# Patient Record
Sex: Female | Born: 1960 | Race: White | Hispanic: No | Smoking: Former smoker
Health system: Southern US, Community
[De-identification: ages and names within clinical notes are randomized; demographics above are authoritative.]

## PROBLEM LIST (undated history)

## (undated) DIAGNOSIS — F329 Major depressive disorder, single episode, unspecified: Secondary | ICD-10-CM

## (undated) DIAGNOSIS — F419 Anxiety disorder, unspecified: Secondary | ICD-10-CM

## (undated) DIAGNOSIS — N83209 Unspecified ovarian cyst, unspecified side: Secondary | ICD-10-CM

## (undated) DIAGNOSIS — T7840XA Allergy, unspecified, initial encounter: Secondary | ICD-10-CM

## (undated) DIAGNOSIS — F32A Depression, unspecified: Secondary | ICD-10-CM

## (undated) DIAGNOSIS — E079 Disorder of thyroid, unspecified: Secondary | ICD-10-CM

## (undated) DIAGNOSIS — B029 Zoster without complications: Secondary | ICD-10-CM

## (undated) DIAGNOSIS — G43909 Migraine, unspecified, not intractable, without status migrainosus: Secondary | ICD-10-CM

## (undated) DIAGNOSIS — N393 Stress incontinence (female) (male): Principal | ICD-10-CM

## (undated) DIAGNOSIS — K219 Gastro-esophageal reflux disease without esophagitis: Secondary | ICD-10-CM

## (undated) HISTORY — DX: Unspecified ovarian cyst, unspecified side: N83.209

## (undated) HISTORY — DX: Allergy, unspecified, initial encounter: T78.40XA

## (undated) HISTORY — DX: Disorder of thyroid, unspecified: E07.9

## (undated) HISTORY — DX: Depression, unspecified: F32.A

## (undated) HISTORY — DX: Stress incontinence (female) (male): N39.3

## (undated) HISTORY — DX: Migraine, unspecified, not intractable, without status migrainosus: G43.909

## (undated) HISTORY — DX: Anxiety disorder, unspecified: F41.9

## (undated) HISTORY — DX: Zoster without complications: B02.9

## (undated) HISTORY — DX: Major depressive disorder, single episode, unspecified: F32.9

## (undated) HISTORY — DX: Gastro-esophageal reflux disease without esophagitis: K21.9

---
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
## 2000-08-04 ENCOUNTER — Other Ambulatory Visit: Admission: RE | Admit: 2000-08-04 | Discharge: 2000-08-04 | Payer: Self-pay | Admitting: *Deleted

## 2001-05-20 ENCOUNTER — Ambulatory Visit (HOSPITAL_COMMUNITY): Admission: RE | Admit: 2001-05-20 | Discharge: 2001-05-20 | Payer: Self-pay | Admitting: *Deleted

## 2001-05-21 ENCOUNTER — Encounter: Payer: Self-pay | Admitting: *Deleted

## 2001-07-08 ENCOUNTER — Encounter: Payer: Self-pay | Admitting: Orthopedic Surgery

## 2001-07-08 ENCOUNTER — Encounter: Admission: RE | Admit: 2001-07-08 | Discharge: 2001-07-08 | Payer: Self-pay | Admitting: Orthopedic Surgery

## 2002-05-09 ENCOUNTER — Other Ambulatory Visit: Admission: RE | Admit: 2002-05-09 | Discharge: 2002-05-09 | Payer: Self-pay | Admitting: *Deleted

## 2002-05-12 ENCOUNTER — Encounter: Payer: Self-pay | Admitting: *Deleted

## 2002-05-12 ENCOUNTER — Encounter: Admission: RE | Admit: 2002-05-12 | Discharge: 2002-05-12 | Payer: Self-pay | Admitting: *Deleted

## 2007-03-10 ENCOUNTER — Encounter: Admission: RE | Admit: 2007-03-10 | Discharge: 2007-03-10 | Payer: Self-pay | Admitting: Family Medicine

## 2007-03-11 ENCOUNTER — Encounter: Admission: RE | Admit: 2007-03-11 | Discharge: 2007-03-11 | Payer: Self-pay | Admitting: Family Medicine

## 2007-06-03 ENCOUNTER — Ambulatory Visit: Payer: Self-pay | Admitting: Endocrinology

## 2007-07-01 ENCOUNTER — Encounter: Admission: RE | Admit: 2007-07-01 | Discharge: 2007-07-01 | Payer: Self-pay | Admitting: Endocrinology

## 2007-08-24 ENCOUNTER — Encounter: Payer: Self-pay | Admitting: *Deleted

## 2007-08-24 DIAGNOSIS — E042 Nontoxic multinodular goiter: Secondary | ICD-10-CM | POA: Insufficient documentation

## 2007-08-24 DIAGNOSIS — G4700 Insomnia, unspecified: Secondary | ICD-10-CM | POA: Insufficient documentation

## 2007-08-25 ENCOUNTER — Encounter: Payer: Self-pay | Admitting: Endocrinology

## 2007-08-25 ENCOUNTER — Ambulatory Visit: Payer: Self-pay | Admitting: Endocrinology

## 2007-08-25 DIAGNOSIS — F411 Generalized anxiety disorder: Secondary | ICD-10-CM | POA: Insufficient documentation

## 2007-08-25 LAB — CONVERTED CEMR LAB: TSH: 0.15 microintl units/mL — ABNORMAL LOW (ref 0.35–5.50)

## 2007-10-13 ENCOUNTER — Ambulatory Visit: Payer: Self-pay | Admitting: Endocrinology

## 2007-11-23 ENCOUNTER — Ambulatory Visit (HOSPITAL_COMMUNITY): Admission: RE | Admit: 2007-11-23 | Discharge: 2007-11-23 | Payer: Self-pay | Admitting: Family Medicine

## 2008-02-10 ENCOUNTER — Encounter: Admission: RE | Admit: 2008-02-10 | Discharge: 2008-02-10 | Payer: Self-pay | Admitting: Family Medicine

## 2008-02-16 ENCOUNTER — Ambulatory Visit: Payer: Self-pay | Admitting: Endocrinology

## 2008-02-16 DIAGNOSIS — E039 Hypothyroidism, unspecified: Secondary | ICD-10-CM | POA: Insufficient documentation

## 2008-02-17 ENCOUNTER — Telehealth: Payer: Self-pay | Admitting: Endocrinology

## 2008-12-06 ENCOUNTER — Ambulatory Visit: Payer: Self-pay | Admitting: Endocrinology

## 2008-12-06 DIAGNOSIS — I10 Essential (primary) hypertension: Secondary | ICD-10-CM | POA: Insufficient documentation

## 2009-02-15 ENCOUNTER — Other Ambulatory Visit: Admission: RE | Admit: 2009-02-15 | Discharge: 2009-02-15 | Payer: Self-pay | Admitting: Family Medicine

## 2010-11-24 HISTORY — PX: ENDOMETRIAL ABLATION: SHX621

## 2010-12-15 ENCOUNTER — Encounter: Payer: Self-pay | Admitting: Endocrinology

## 2011-04-08 NOTE — Consult Note (Signed)
[facility]                          ENDOCRINOLOGY CONSULTATION   Erin Anderson, Erin Anderson                       MRN:          [id number]  DATE:06/03/2007                            DOB:          11-15-1961    REFERRING PHYSICIAN:  Talmadge Coventry, M.D.   REASON FOR REFERRAL:  Hyperthyroidism.   HISTORY OF PRESENT ILLNESS:  Patient is a 50 year old woman, who was  noted about five years ago to have a suppressed TSH and it has persisted  since then.  Symptomatically, she has about one year of insomnia with  some associated slight weight-gain, but no numbness of her feet.   PAST MEDICAL HISTORY:  Otherwise healthy.   MEDICATIONS:  Her only medication is occasional Xanax.   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  She is separated from her husband.   FAMILY HISTORY:  Negative for thyroid disease in her immediate family.   REVIEW OF SYSTEMS:  Denies the following:  Fever, syncope, shortness of  breath, dysphagia, and tremor.   PHYSICAL EXAMINATION:  Blood pressure 125/81, heart rate 81, temperature  is 98.8, the weight is 150.  GENERAL:  No distress.  SKIN:  No rash, not diaphoretic.  HEENT:  No proptosis, no periorbital swelling.  NECK:  There is a 2 cm diameter right-sided thyroid nodule.  I do not  detect any other abnormality of the thyroid.  CHEST:  Clear to auscultation, no respiratory distress.  CARDIOVASCULAR:  No edema, regular rate and rhythm, no murmur.  NEUROLOGIC:  Alert and oriented, does not appear anxious, nor depressed,  and there is no tremor.   LABORATORY STUDIES:  Forwarded by Dr. Smith Mince:  Thyroid nuclear  medicine study on March 11, 2007, shows a 24-hour uptake of 19.8% with a  patchy uptake, greatest at the right lower pole.  Thyroid ultrasound on  March 11, 2007, shows a multinodular goiter, the largest nodule of which  is on the right lower pole, 23 x 15 x 19 cm.  I have also reviewed  multiple TSHs she has sent, all of which are slightly suppressed,  between 2003 and 2008.   IMPRESSION:  1. Multinodular goiter, which is usually hereditary.  The dominant      nodule is on the right side and is the only one that is palpable.  2. Hyperthyroidism, due to #1.  3. Insomnia, which is unlikely to be thyroid-related, but it is      possible.   PLAN:  1. I discussed treatment options with her and recommended iodine-131      therapy.  She agrees.  This will be ordered and she will return      here in about two months.  2. We discussed the natural history and risks of hyperthyroidism, due      to a multinodular goiter.     Sean A. Everardo All, MD  Electronically Signed    SAE/MedQ  DD: 06/04/2007  DT: 06/04/2007  Job #: [id number]

## 2011-12-03 ENCOUNTER — Ambulatory Visit (INDEPENDENT_AMBULATORY_CARE_PROVIDER_SITE_OTHER): Payer: BC Managed Care – PPO | Admitting: Family Medicine

## 2011-12-03 DIAGNOSIS — N760 Acute vaginitis: Secondary | ICD-10-CM

## 2011-12-30 ENCOUNTER — Ambulatory Visit (INDEPENDENT_AMBULATORY_CARE_PROVIDER_SITE_OTHER): Payer: BC Managed Care – PPO | Admitting: Family Medicine

## 2011-12-30 VITALS — BP 114/70 | HR 68 | Temp 98.0°F | Resp 16 | Ht 63.0 in | Wt 127.0 lb

## 2011-12-30 DIAGNOSIS — N760 Acute vaginitis: Secondary | ICD-10-CM

## 2011-12-30 DIAGNOSIS — IMO0002 Reserved for concepts with insufficient information to code with codable children: Secondary | ICD-10-CM

## 2011-12-30 DIAGNOSIS — N898 Other specified noninflammatory disorders of vagina: Secondary | ICD-10-CM

## 2011-12-30 LAB — POCT WET PREP WITH KOH
Clue Cells Wet Prep HPF POC: 100
Yeast Wet Prep HPF POC: NEGATIVE

## 2011-12-30 MED ORDER — METRONIDAZOLE 500 MG PO TABS: 500.0000 mg | ORAL_TABLET | Freq: Two times a day (BID) | ORAL | Status: AC

## 2011-12-30 NOTE — Progress Notes (Signed)
  Subjective:    Patient ID: Erin Anderson, female    DOB: 13-Dec-1960, 50 y.o.   MRN: [id number]  HPI 51 yo female with c/o vaginal irritation for one month.  Seen 12/03/11 and treated for BV and yeast with metrogel vaginal and diflucan.  Improved some but still irritated.  Still pain with sex at introitus. Haven't had this before.  Perimenopausal - not quite through menopause yet.  Due to go back to gyn in about a month.     Review of Systems Negative except as per HPI     Objective:   Physical Exam  Constitutional: She appears well-developed and well-nourished.  Neurological: She is alert.   Introitus erythematous but otherwise normal Moderate white to yellow discharge at cervix and in vagina Cervix normal  Results for orders placed in visit on 12/30/11  POCT WET PREP WITH KOH      Component Value Range   Trichomonas, UA Negative     Clue Cells Wet Prep HPF POC 100%     Epithelial Wet Prep HPF POC Lg Clumps     Yeast Wet Prep HPF POC Neg     Bacteria Wet Prep HPF POC 3+     RBC Wet Prep HPF POC neg     WBC Wet Prep HPF POC 2-5 with TNTC on Clues     KOH Prep POC              Assessment & Plan:  Vaginal discharge Dyspareunia  Persistent BV - flagyl 500 BID

## 2012-01-01 ENCOUNTER — Ambulatory Visit (INDEPENDENT_AMBULATORY_CARE_PROVIDER_SITE_OTHER): Payer: BC Managed Care – PPO | Admitting: Family Medicine

## 2012-01-01 VITALS — BP 146/98 | HR 84 | Temp 98.8°F | Resp 20 | Ht 64.0 in | Wt 123.4 lb

## 2012-01-01 DIAGNOSIS — R002 Palpitations: Secondary | ICD-10-CM

## 2012-01-01 DIAGNOSIS — I499 Cardiac arrhythmia, unspecified: Secondary | ICD-10-CM

## 2012-01-01 DIAGNOSIS — I491 Atrial premature depolarization: Secondary | ICD-10-CM

## 2012-01-01 LAB — BASIC METABOLIC PANEL
BUN: 12 mg/dL (ref 6–23)
Calcium: 9.8 mg/dL (ref 8.4–10.5)

## 2012-01-01 LAB — TSH: TSH: 0.976 u[IU]/mL (ref 0.350–4.500)

## 2012-01-01 LAB — GC/CHLAMYDIA PROBE AMP, GENITAL: Chlamydia, DNA Probe: NEGATIVE

## 2012-01-01 NOTE — Progress Notes (Signed)
  Subjective:    Patient ID: Erin Anderson, female    DOB: 17-Jan-1961, 51 y.o.   MRN: [id number]  HPI  Patient has had a history of some palpitations in the past, but had an EKG 1 year ago which was normal. [facility] symptoms, and was given antibiotics for fluid in her ears. However the provider noted that she had an irregular heartbeat and recommended she come get checked, so she is over here for that. A week ago boyfriend commented that her heart sounded funny, but she shrugged it off as being from stress and anxiety and maybe nothing. She is scheduled to go out of town today for a mini vacation, but does not feel like she is particularly anxious. She has been under a lot of stress however. She denies any chest pains, but has been aware of a funny feeling there.  She is on thyroid medication but it was last checked almost a year ago.  She does not smoke. She does not do a lot of regular exercise.  Review of Systems Having the ear problems as noted above. No other major symptoms.    Objective:   Physical Exam Alert pleasant female no acute distress. Neck supple without nodes or thyromegaly. Chest is clear to auscultation. Heart has very frequent ectopy, no murmurs were noted. I repeated the blood pressure and it was 120/70.       Assessment & Plan:  Irregular heartbeat History of hypothyroidism Stress  Will check an EKG and thyroid functions and electrolytes and decide on treatment accordingly

## 2012-01-01 NOTE — Patient Instructions (Signed)
You are having what is called premature atrial contractions. [facility]). These are basically a harmless irregular beat that we will be a walk aware of but are not doing any other special tests at this time other than the blood work we have ordered. If you're having more problems with them or ever develop chest pain or passing out symptoms you should return and get checked out further.  Premature Beats You are having premature beats of your heart. This is a common condition which is usually not caused by heart disease or other major illnesses. Premature beats may cause no symptoms. Premature beats may cause palpitations. This makes you feel like your heart is skipping a beat. Sometimes there is even slight chest pain. Premature beats are called PAC's or PVC's, depending on the area of the heart where they start. They may be brought on by a variety of factors including emotional stress and lack of sleep. Caffeine, asthma medicines, stimulants, herbal teas, dietary supplements, and alcohol can also be involved. Evaluation may require heart monitoring at home for several days. You should avoid coffee, tea, colas, and alcoholic beverages and be sure to get plenty of rest over the next few days until your symptoms improve. Do not smoke tobacco. See your caregiver if your symptoms continue after 1 to 2 days of rest. If these are common, treatment may be needed to reduce or eliminate these extra beats.  SEEK IMMEDIATE MEDICAL CARE IF:  You develop severe chest or abdominal pain, or pain that radiates up into the neck, arm or jaw.   You faint or have extreme weakness.   You develop shortness of breath or a racing heart for more than 5 seconds.  Document Released: 12/18/2004 Document Revised: 05/26/2011 Document Reviewed: 01/07/2008 [facility] Patient Information 2012 [place], [place].

## 2012-01-13 ENCOUNTER — Telehealth: Payer: Self-pay

## 2012-01-13 NOTE — Telephone Encounter (Signed)
.  [facility]     PT HAS FINISHED ALL MEDS FOR YEAST INFECTION,STILL HAS Erin Anderson ADVISE   BEST PHONE [phone]   [facility]

## 2012-01-13 NOTE — Telephone Encounter (Signed)
PT IS CALLING AGAIN  [phone]

## 2012-01-14 NOTE — Telephone Encounter (Signed)
Called pt at work number left in message and spoke with pt. D/W her Dr Roslynn Amble recommendation to RTC if Sxs persist after round of medication. Pt asked Dr Roslynn Amble schedule and agreed to try to come back tomorrow.

## 2012-01-14 NOTE — Telephone Encounter (Signed)
LMOM that per Dr Georgiana Shore OV note, pt should RTC for re-eval if Sxs persist after round of medication. Asked for CB with any further questions.

## 2012-01-15 ENCOUNTER — Ambulatory Visit (INDEPENDENT_AMBULATORY_CARE_PROVIDER_SITE_OTHER): Payer: BC Managed Care – PPO | Admitting: Family Medicine

## 2012-01-15 VITALS — BP 132/84 | HR 77 | Temp 98.1°F | Resp 16 | Ht 64.0 in | Wt 124.8 lb

## 2012-01-15 DIAGNOSIS — N771 Vaginitis, vulvitis and vulvovaginitis in diseases classified elsewhere: Secondary | ICD-10-CM

## 2012-01-15 DIAGNOSIS — B373 Candidiasis of vulva and vagina: Secondary | ICD-10-CM

## 2012-01-15 DIAGNOSIS — N760 Acute vaginitis: Secondary | ICD-10-CM

## 2012-01-15 DIAGNOSIS — H6980 Other specified disorders of Eustachian tube, unspecified ear: Secondary | ICD-10-CM

## 2012-01-15 DIAGNOSIS — R002 Palpitations: Secondary | ICD-10-CM

## 2012-01-15 LAB — POCT URINALYSIS DIPSTICK
Ketones, UA: NEGATIVE
Nitrite, UA: NEGATIVE
pH, UA: 7

## 2012-01-15 LAB — POCT UA - MICROSCOPIC ONLY
Bacteria, U Microscopic: NEGATIVE
Crystals, Ur, HPF, POC: NEGATIVE
Mucus, UA: NEGATIVE
RBC, urine, microscopic: NEGATIVE

## 2012-01-15 LAB — POCT WET PREP WITH KOH: KOH Prep POC: POSITIVE

## 2012-01-15 MED ORDER — FLUCONAZOLE 150 MG PO TABS: ORAL_TABLET | ORAL | Status: DC

## 2012-01-15 NOTE — Progress Notes (Signed)
  Subjective:    Patient ID: Erin Anderson, female    DOB: 10/03/1961, 51 y.o.   MRN: [id number]  HPI 51 yo female seen 12/30/11 for vaginal complaints.  Diagnosed with bacterial vaginosis.  Treated with oral flagyl.  Symptoms never fully went away.  Still has pain and irritated area from before.  Still painful during intercourse, especially initially.    Also seen recently for palpitations - occur occasionally.  TSH low normal when checked.    Also seen recently for fluid in the ears.  Thinks it is better but wants it chekced.   Review of Systems    Negative except as per HPI  Objective:   Physical Exam  Constitutional: She appears well-developed and well-nourished.  HENT:  Right Ear: Tympanic membrane and ear canal normal.  Left Ear: Tympanic membrane and ear canal normal.  Cardiovascular: Normal rate, regular rhythm, normal heart sounds and intact distal pulses.   No murmur heard. Pulmonary/Chest: Effort normal and breath sounds normal.  Neurological: She is alert.  Skin: Skin is warm and dry.   Moderate, thick discharge in vaginal vault.  Introitus red and inflamed.   Results for orders placed in visit on 01/15/12  POCT UA - MICROSCOPIC ONLY      Component Value Range   WBC, Ur, HPF, POC neg     RBC, urine, microscopic neg     Bacteria, U Microscopic neg     Mucus, UA neg     Epithelial cells, urine per micros 0-1     Crystals, Ur, HPF, POC neg     Casts, Ur, LPF, POC neg     Yeast, UA Postive    POCT URINALYSIS DIPSTICK      Component Value Range   Color, UA yellow     Clarity, UA clear     Glucose, UA neg     Bilirubin, UA neg     Ketones, UA neg     Spec Grav, UA 1.010     Blood, UA neg     pH, UA 7.0     Protein, UA neg     Urobilinogen, UA 0.2     Nitrite, UA neg     Leukocytes, UA Trace    POCT WET PREP WITH KOH      Component Value Range   Trichomonas, UA Negative     Clue Cells Wet Prep HPF POC 1-3     Epithelial Wet Prep HPF POC 6-13     Yeast Wet  Prep HPF POC positive     Bacteria Wet Prep HPF POC 1+     RBC Wet Prep HPF POC 1-2     WBC Wet Prep HPF POC 6-12     KOH Prep POC Positive         Assessment & Plan:  Vaginitis - yeast.  Diflucan.  Try diaper cream to irritated area for a few days Palpitations - normal heart rate today.  Under a lot of stress.  Long standing problem.  On low dose synthroid.  Will not change at thist ime but could consider if persists. ETD - clear today.

## 2012-01-28 ENCOUNTER — Other Ambulatory Visit: Payer: Self-pay | Admitting: Family Medicine

## 2012-01-28 MED ORDER — LEVOTHYROXINE SODIUM 75 MCG PO TABS: 75.0000 ug | ORAL_TABLET | Freq: Every day | ORAL | Status: DC

## 2012-02-05 ENCOUNTER — Other Ambulatory Visit: Payer: Self-pay | Admitting: Physician Assistant

## 2012-02-05 MED ORDER — TRAMADOL HCL 50 MG PO TABS: 50.0000 mg | ORAL_TABLET | Freq: Three times a day (TID) | ORAL | Status: DC | PRN

## 2012-04-23 ENCOUNTER — Other Ambulatory Visit: Payer: Self-pay | Admitting: Gastroenterology

## 2012-04-23 DIAGNOSIS — R109 Unspecified abdominal pain: Secondary | ICD-10-CM

## 2012-04-27 ENCOUNTER — Ambulatory Visit
Admission: RE | Admit: 2012-04-27 | Discharge: 2012-04-27 | Disposition: A | Discharge status: Home / Self Care | Payer: BC Managed Care – PPO | Source: Ambulatory Visit | Attending: Gastroenterology | Admitting: Gastroenterology

## 2012-04-27 DIAGNOSIS — R109 Unspecified abdominal pain: Secondary | ICD-10-CM

## 2012-05-09 ENCOUNTER — Other Ambulatory Visit: Payer: Self-pay | Admitting: *Deleted

## 2012-05-09 MED ORDER — MOMETASONE FUROATE 50 MCG/ACT NA SUSP: 2.0000 | Freq: Every day | NASAL | Status: DC

## 2012-05-10 ENCOUNTER — Other Ambulatory Visit: Payer: Self-pay | Admitting: Physician Assistant

## 2012-05-14 ENCOUNTER — Telehealth: Payer: Self-pay | Admitting: Family Medicine

## 2012-05-14 MED ORDER — CYCLOBENZAPRINE HCL 5 MG PO TABS: 5.0000 mg | ORAL_TABLET | Freq: Three times a day (TID) | ORAL | Status: DC | PRN

## 2012-05-14 NOTE — Telephone Encounter (Signed)
Patient would like a refill on cyclobenzaprine.  Stated that [facility] filled them the last time she was here in January.  We filled her Tramadol yesturday.  Can we refill this for her?

## 2012-05-14 NOTE — Telephone Encounter (Signed)
Spoke with patient and let her know that rx was sent to pharmacy.

## 2012-05-14 NOTE — Telephone Encounter (Signed)
Done and sent in 

## 2012-06-08 ENCOUNTER — Other Ambulatory Visit: Payer: Self-pay | Admitting: *Deleted

## 2012-06-08 MED ORDER — LORAZEPAM 0.5 MG PO TABS: 0.5000 mg | ORAL_TABLET | Freq: Three times a day (TID) | ORAL | Status: DC

## 2012-07-08 ENCOUNTER — Ambulatory Visit (INDEPENDENT_AMBULATORY_CARE_PROVIDER_SITE_OTHER): Payer: BC Managed Care – PPO | Admitting: Emergency Medicine

## 2012-07-08 VITALS — BP 140/83 | HR 114 | Temp 98.3°F | Resp 18 | Ht 63.0 in | Wt 129.0 lb

## 2012-07-08 DIAGNOSIS — S335XXA Sprain of ligaments of lumbar spine, initial encounter: Secondary | ICD-10-CM

## 2012-07-08 MED ORDER — CYCLOBENZAPRINE HCL 10 MG PO TABS: 10.0000 mg | ORAL_TABLET | Freq: Three times a day (TID) | ORAL | Status: AC | PRN

## 2012-07-08 MED ORDER — KETOROLAC TROMETHAMINE 60 MG/2ML IM SOLN
60.0000 mg | Freq: Once | INTRAMUSCULAR | Status: AC
  Administered 2012-07-08: 60 mg via INTRAMUSCULAR

## 2012-07-08 MED ORDER — HYDROCODONE-ACETAMINOPHEN 5-325 MG PO TABS: 1.0000 | ORAL_TABLET | ORAL | Status: AC | PRN

## 2012-07-08 MED ORDER — NAPROXEN SODIUM 550 MG PO TABS: 550.0000 mg | ORAL_TABLET | Freq: Two times a day (BID) | ORAL | Status: DC

## 2012-07-08 NOTE — Patient Instructions (Signed)
Back Pain, Adult Low back pain is very common. About 1 in 5 people have back pain.The cause of low back pain is rarely dangerous. The pain often gets better over time.About half of people with a sudden onset of back pain feel better in just 2 weeks. About 8 in 10 people feel better by 6 weeks.  CAUSES Some common causes of back pain include:  Strain of the muscles or ligaments supporting the spine.   Wear and tear (degeneration) of the spinal discs.   Arthritis.   Direct injury to the back.  DIAGNOSIS Most of the time, the direct cause of low back pain is not known.However, back pain can be treated effectively even when the exact cause of the pain is unknown.Answering your caregiver's questions about your overall health and symptoms is one of the most accurate ways to make sure the cause of your pain is not dangerous. If your caregiver needs more information, he or she may order lab work or imaging tests (X-rays or MRIs).However, even if imaging tests show changes in your back, this usually does not require surgery. HOME CARE INSTRUCTIONS For many people, back pain returns.Since low back pain is rarely dangerous, it is often a condition that people can learn to manageon their own.   Remain active. It is stressful on the back to sit or stand in one place. Do not sit, drive, or stand in one place for more than 30 minutes at a time. Take short walks on level surfaces as soon as pain allows.Try to increase the length of time you walk each day.   Do not stay in bed.Resting more than 1 or 2 days can delay your recovery.   Do not avoid exercise or work.Your body is made to move.It is not dangerous to be active, even though your back may hurt.Your back will likely heal faster if you return to being active before your pain is gone.   Pay attention to your body when you bend and lift. Many people have less discomfortwhen lifting if they bend their knees, keep the load close to their  bodies,and avoid twisting. Often, the most comfortable positions are those that put less stress on your recovering back.   Find a comfortable position to sleep. Use a firm mattress and lie on your side with your knees slightly bent. If you lie on your back, put a pillow under your knees.   Only take over-the-counter or prescription medicines as directed by your caregiver. Over-the-counter medicines to reduce pain and inflammation are often the most helpful.Your caregiver may prescribe muscle relaxant drugs.These medicines help dull your pain so you can more quickly return to your normal activities and healthy exercise.   Put ice on the injured area.   Put ice in a plastic bag.   Place a towel between your skin and the bag.   Leave the ice on for 15 to 20 minutes, 3 to 4 times a day for the first 2 to 3 days. After that, ice and heat may be alternated to reduce pain and spasms.   Ask your caregiver about trying back exercises and gentle massage. This may be of some benefit.   Avoid feeling anxious or stressed.Stress increases muscle tension and can worsen back pain.It is important to recognize when you are anxious or stressed and learn ways to manage it.Exercise is a great option.  SEEK MEDICAL CARE IF:  You have pain that is not relieved with rest or medicine.   You have   pain that does not improve in 1 week.   You have new symptoms.   You are generally not feeling well.  SEEK IMMEDIATE MEDICAL CARE IF:   You have pain that radiates from your back into your legs.   You develop new bowel or bladder control problems.   You have unusual weakness or numbness in your arms or legs.   You develop nausea or vomiting.   You develop abdominal pain.   You feel faint.  Document Released: 11/10/2005 Document Revised: 10/30/2011 Document Reviewed: 03/31/2011 [employer] Patient Information 2012 [employer]. 

## 2012-07-08 NOTE — Progress Notes (Signed)
   Date:  07/08/2012   Name:  Erin Anderson   DOB:  Apr 03, 1961   MRN:  [id number] Gender: female  Age: 51 y.o.  PCP:  Romero Belling, MD    Chief Complaint: Back Pain   History of Present Illness:  Erin Anderson is a 51 y.o. pleasant patient who presents with the following:  Stretching and bent from waist touching the floor and developed sharp pain in her low back while straightening up.  Non radiating and no neuro symptoms.  No history of back pain.  Went to work but unable to sit still due to pain and came to office.  Patient Active Problem List  Diagnosis  . GOITER, MULTINODULAR  . HYPOTHYROIDISM, POST-RADIATION  . ANXIETY  . HYPERTENSION  . INSOMNIA    No past medical history on file.  No past surgical history on file.  History  Substance Use Topics  . Smoking status: Former Smoker -- 1.0 packs/day for 10 years    Types: Cigarettes    Quit date: 12/31/1985  . Smokeless tobacco: Never Used  . Alcohol Use: Not on file    No family history on file.  Allergies  Allergen Reactions  . Sulfa Antibiotics   . Sulfonamide Derivatives     Medication list has been reviewed and updated.  Current Outpatient Prescriptions on File Prior to Visit  Medication Sig Dispense Refill  . levothyroxine (SYNTHROID, LEVOTHROID) 75 MCG tablet Take 1 tablet (75 mcg total) by mouth daily.  90 tablet  1  . LORazepam (ATIVAN) 0.5 MG tablet Take 1 tablet (0.5 mg total) by mouth every 8 (eight) hours.  60 tablet  0  . mometasone (NASONEX) 50 MCG/ACT nasal spray Place 2 sprays into the nose daily.  17 g  5  . sertraline (ZOLOFT) 50 MG tablet Take 50 mg by mouth daily.      . traMADol (ULTRAM) 50 MG tablet TAKE ONE TABLET BY MOUTH THREE TIMES DAILY AS NEEDED FOR PAIN  30 tablet  0  . fluconazole (DIFLUCAN) 150 MG tablet 1 po today.  Repeat in 3 days.  2 tablet  0    Review of Systems:  As per HPI, otherwise negative.    Physical Examination: Filed Vitals:   07/08/12 1246  BP:  140/83  Pulse: 114  Temp: 98.3 F (36.8 C)  Resp: 18   Filed Vitals:   07/08/12 1246  Height: 5\' 3"  (1.6 m)  Weight: 129 lb (58.514 kg)   Body mass index is 22.85 kg/(m^2). Ideal Body Weight: Weight in (lb) to have BMI = 25: 140.8    GEN: WDWN, NAD, Non-toxic, Alert & Oriented x 3 HEENT: Atraumatic, Normocephalic.  Ears and Nose: No external deformity. EXTR: No clubbing/cyanosis/edema NEURO: Normal gait.  PSYCH: Normally interactive. Conversant. Not depressed or anxious appearing.  Calm demeanor.  Back:  Tender in sacral paraspinous muscles.  Neuro grossly intact with equal muscle strength.  SLR negative  Assessment and Plan: Lumbar strain Anaprox Flexeril vicodin Follow up as needed  Carmelina Dane, MD

## 2012-07-08 NOTE — Addendum Note (Signed)
Addended by: Sheldon Silvan on: 07/08/2012 01:43 PM   Modules accepted: Orders

## 2012-08-13 ENCOUNTER — Other Ambulatory Visit: Payer: Self-pay | Admitting: Physician Assistant

## 2012-08-22 ENCOUNTER — Other Ambulatory Visit: Payer: Self-pay | Admitting: *Deleted

## 2012-08-22 MED ORDER — LORAZEPAM 0.5 MG PO TABS: 0.5000 mg | ORAL_TABLET | Freq: Three times a day (TID) | ORAL | Status: DC

## 2012-08-25 ENCOUNTER — Other Ambulatory Visit: Payer: Self-pay | Admitting: Physician Assistant

## 2012-10-19 ENCOUNTER — Other Ambulatory Visit: Payer: Self-pay | Admitting: Family Medicine

## 2012-10-20 ENCOUNTER — Other Ambulatory Visit: Payer: Self-pay | Admitting: *Deleted

## 2012-10-20 MED ORDER — LORAZEPAM 0.5 MG PO TABS: 0.5000 mg | ORAL_TABLET | Freq: Three times a day (TID) | ORAL | Status: DC

## 2012-10-20 NOTE — Telephone Encounter (Signed)
rx called in

## 2012-11-08 ENCOUNTER — Other Ambulatory Visit: Payer: Self-pay | Admitting: Physician Assistant

## 2012-11-08 NOTE — Telephone Encounter (Signed)
Needs office visit.

## 2012-12-12 ENCOUNTER — Other Ambulatory Visit: Payer: Self-pay | Admitting: Physician Assistant

## 2012-12-12 NOTE — Telephone Encounter (Signed)
Needs/ OV labs, sent with note in regards to this.

## 2012-12-24 ENCOUNTER — Ambulatory Visit: Payer: BC Managed Care – PPO | Admitting: Family Medicine

## 2012-12-31 ENCOUNTER — Ambulatory Visit: Payer: BC Managed Care – PPO | Admitting: Family Medicine

## 2013-01-21 ENCOUNTER — Ambulatory Visit: Payer: BC Managed Care – PPO | Admitting: Family Medicine

## 2013-01-21 ENCOUNTER — Ambulatory Visit (INDEPENDENT_AMBULATORY_CARE_PROVIDER_SITE_OTHER): Payer: BC Managed Care – PPO | Admitting: Family Medicine

## 2013-01-21 ENCOUNTER — Encounter: Payer: Self-pay | Admitting: Family Medicine

## 2013-01-21 VITALS — BP 164/96 | HR 79 | Temp 98.1°F | Resp 16 | Ht 64.0 in | Wt 139.0 lb

## 2013-01-21 DIAGNOSIS — E890 Postprocedural hypothyroidism: Secondary | ICD-10-CM

## 2013-01-21 DIAGNOSIS — R51 Headache: Secondary | ICD-10-CM

## 2013-01-21 DIAGNOSIS — I10 Essential (primary) hypertension: Secondary | ICD-10-CM

## 2013-01-21 MED ORDER — CETIRIZINE HCL 10 MG PO TABS: 10.0000 mg | ORAL_TABLET | Freq: Every day | ORAL | Status: DC

## 2013-01-21 MED ORDER — BUTALBITAL-APAP-CAFF-COD 50-325-40-30 MG PO CAPS
1.0000 | ORAL_CAPSULE | ORAL | Status: DC | PRN
Start: 2013-01-21 — End: 2013-02-11

## 2013-01-21 MED ORDER — SERTRALINE HCL 100 MG PO TABS: 50.0000 mg | ORAL_TABLET | Freq: Every day | ORAL | Status: DC

## 2013-01-21 MED ORDER — FLUTICASONE PROPIONATE 50 MCG/ACT NA SUSP: 2.0000 | Freq: Every day | NASAL | Status: DC

## 2013-01-21 MED ORDER — PROMETHAZINE HCL 25 MG PO TABS: 25.0000 mg | ORAL_TABLET | Freq: Three times a day (TID) | ORAL | Status: DC | PRN

## 2013-01-21 MED ORDER — LEVOTHYROXINE SODIUM 75 MCG PO TABS: ORAL_TABLET | ORAL | Status: DC

## 2013-01-21 NOTE — Progress Notes (Signed)
Subjective:    Patient ID: Erin Anderson, female    DOB: 11-05-1961, 52 y.o.   MRN: [id number]  HPI  Has some chronic HAs, getting worse, waking up with them - wakes up from sleep - is in center of forehead - was so severe she almost went to hosp.  Has started having nausea and vomiting due to severity of pian. Her prev PCP tried her on migraine meds like imitrex but made her arms feel weird and din't help HAs at all and she saw Benny Lennert PA who tried her on flexeril and tramadol - not touching it.   Not having a HA daily - will sometimes get daily and then have a break for a few days to a wk - but then when she gets them they will occur daily for a sev days and last the whole day when she gets a headache - until she can go to sleep. Have now have daily headaches for about 2 wks and much worse than usual with pain in the center of her forehead, no radiation. +photophobia and phonophobia, no vision changes.no aura, no lacrimation or rhinorrhea Takes sudafed which might help a little, heating pad on face or back of neck. Getting so bad she has to leave work. Was having prob w/ her stomach so had upper and lower endoscopy and was put on prilosec so told to stop taking nsaids so has been trying to stay away from excedrin migraine, bc powders, etc.  Takes naprosyn occ and tylenol occ but hasn't been touching it at all. Does not take any meds > 3d/wk. Does have a h/o sinus infections so initially thought it was that but now much worse than a sinus headache and otc allergy meds haven't helped at all.  Works at [employer] and has nurse check her BP occ outside of office at it is always nml.  Past Medical History  Diagnosis Date  . Allergy   . Anxiety    Current Outpatient Prescriptions on File Prior to Visit  Medication Sig Dispense Refill  . LORazepam (ATIVAN) 0.5 MG tablet Take 1 tablet (0.5 mg total) by mouth every 8 (eight) hours.  60 tablet  0  . mometasone (NASONEX) 50 MCG/ACT nasal  spray Place 2 sprays into the nose daily.  17 g  5  . naproxen sodium (ANAPROX DS) 550 MG tablet Take 1 tablet (550 mg total) by mouth 2 (two) times daily with a meal.  40 tablet  0  . fluconazole (DIFLUCAN) 150 MG tablet 1 po today.  Repeat in 3 days.  2 tablet  0   No current facility-administered medications on file prior to visit.   Allergies  Allergen Reactions  . Sulfa Antibiotics   . Sulfonamide Derivatives      Review of Systems  Constitutional: Positive for fatigue. Negative for fever, chills, diaphoresis, appetite change and unexpected weight change.  HENT: Positive for sinus pressure. Negative for ear pain, congestion, rhinorrhea, neck pain, neck stiffness, dental problem and tinnitus.   Eyes: Positive for photophobia. Negative for pain, discharge, redness, itching and visual disturbance.  Gastrointestinal: Positive for nausea, vomiting, abdominal pain and abdominal distention.  Skin: Negative for rash.  Neurological: Positive for headaches. Negative for dizziness, tremors, seizures, syncope, facial asymmetry, speech difficulty, weakness, light-headedness and numbness.  Psychiatric/Behavioral: Positive for sleep disturbance.      BP 164/96  Pulse 79  Temp(Src) 98.1 F (36.7 C)  Resp 16  Ht 5\' 4"  (1.626 m)  Wt 139 lb (63.05 kg)  BMI 23.85 kg/m2 Objective:   Physical Exam  Constitutional: She is oriented to person, place, and time. She appears well-developed and well-nourished.  Laying on exam table in dark room, all lights off.  HENT:  Head: Normocephalic and atraumatic.  Right Ear: External ear and ear canal normal. A middle ear effusion is present.  Left Ear: External ear and ear canal normal. Tympanic membrane is retracted. A middle ear effusion is present.  Nose: Mucosal edema present. No rhinorrhea. Right sinus exhibits frontal sinus tenderness. Right sinus exhibits no maxillary sinus tenderness. Left sinus exhibits frontal sinus tenderness. Left sinus exhibits  no maxillary sinus tenderness.  Mouth/Throat: Uvula is midline, oropharynx is clear and moist and mucous membranes are normal. No oropharyngeal exudate.  Eyes: Conjunctivae and EOM are normal. Pupils are equal, round, and reactive to light.  Neck: Normal range of motion. Neck supple. No thyromegaly present.  Cardiovascular: Normal rate, regular rhythm, normal heart sounds and intact distal pulses.   Pulmonary/Chest: Effort normal and breath sounds normal. No respiratory distress.  Neurological: She is alert and oriented to person, place, and time. She has normal strength. No cranial nerve deficit or sensory deficit. Coordination and gait normal.  Skin: Skin is warm and dry. She is not diaphoretic.  Psychiatric: She has a normal mood and affect. Her behavior is normal.          Assessment & Plan:  HYPOTHYROIDISM, POST-RADIATION - Plan: TSH, refilled levothyroxine - additional refills ok if tsh within range.  HYPERTENSION - check BP outside of the office, at work. Likely elev today 2/2 pain but if cont to be elev, will need to start BP meds.  Headache - Plan: promethazine (PHENERGAN) 25 MG tablet, butalbital-acetaminophen-caffeine (FIORICET/CODEINE) 50-325-40-30 MG per capsule, cetirizine (ZYRTEC) 10 MG tablet, fluticasone (FLONASE) 50 MCG/ACT nasal spray, CT Maxillofacial WO CM, TSH.  I do wonder if this could be coming from a chronic sinusitis due to its location so will check sinus CT and if + -> refer to ENT. If neg, refer to neurology HA clinic and cons checking brain MRI.  Pt has CPP sched for < 1 mo so we will f/u then   Meds ordered this encounter  Medications  . levothyroxine (SYNTHROID, LEVOTHROID) 75 MCG tablet    Sig: TAKE ONE TABLET BY MOUTH ONE TIME DAILY    Dispense:  90 tablet    Refill:  0  . sertraline (ZOLOFT) 100 MG tablet    Sig: Take 0.5 tablets (50 mg total) by mouth daily.    Dispense:  45 tablet    Refill:  3         . promethazine (PHENERGAN) 25 MG tablet     Sig: Take 1 tablet (25 mg total) by mouth every 8 (eight) hours as needed for nausea.    Dispense:  20 tablet    Refill:  0  . butalbital-acetaminophen-caffeine (FIORICET/CODEINE) 50-325-40-30 MG per capsule    Sig: Take 1 capsule by mouth every 4 (four) hours as needed for headache.    Dispense:  30 capsule    Refill:  0  . cetirizine (ZYRTEC) 10 MG tablet    Sig: Take 1 tablet (10 mg total) by mouth daily.    Dispense:  30 tablet    Refill:  11  . fluticasone (FLONASE) 50 MCG/ACT nasal spray    Sig: Place 2 sprays into the nose daily.    Dispense:  16 g    Refill:  6    

## 2013-01-24 MED ORDER — LEVOTHYROXINE SODIUM 75 MCG PO TABS: ORAL_TABLET | ORAL | Status: DC

## 2013-01-27 ENCOUNTER — Other Ambulatory Visit: Payer: Self-pay | Admitting: Radiology

## 2013-01-27 ENCOUNTER — Ambulatory Visit
Admission: RE | Admit: 2013-01-27 | Discharge: 2013-01-27 | Disposition: A | Discharge status: Home / Self Care | Payer: BC Managed Care – PPO | Source: Ambulatory Visit | Attending: Family Medicine | Admitting: Family Medicine

## 2013-01-27 DIAGNOSIS — R51 Headache: Secondary | ICD-10-CM

## 2013-01-30 ENCOUNTER — Ambulatory Visit: Payer: BC Managed Care – PPO

## 2013-01-30 ENCOUNTER — Ambulatory Visit (INDEPENDENT_AMBULATORY_CARE_PROVIDER_SITE_OTHER): Payer: BC Managed Care – PPO | Admitting: Family Medicine

## 2013-01-30 ENCOUNTER — Other Ambulatory Visit: Payer: Self-pay | Admitting: Family Medicine

## 2013-01-30 VITALS — BP 162/92 | HR 95 | Temp 98.1°F | Resp 16 | Ht 64.0 in | Wt 135.0 lb

## 2013-01-30 DIAGNOSIS — IMO0002 Reserved for concepts with insufficient information to code with codable children: Secondary | ICD-10-CM

## 2013-01-30 DIAGNOSIS — M25519 Pain in unspecified shoulder: Secondary | ICD-10-CM

## 2013-01-30 DIAGNOSIS — S161XXA Strain of muscle, fascia and tendon at neck level, initial encounter: Secondary | ICD-10-CM

## 2013-01-30 DIAGNOSIS — R519 Headache, unspecified: Secondary | ICD-10-CM | POA: Insufficient documentation

## 2013-01-30 DIAGNOSIS — M25512 Pain in left shoulder: Secondary | ICD-10-CM

## 2013-01-30 DIAGNOSIS — S139XXA Sprain of joints and ligaments of unspecified parts of neck, initial encounter: Secondary | ICD-10-CM

## 2013-01-30 DIAGNOSIS — M542 Cervicalgia: Secondary | ICD-10-CM

## 2013-01-30 DIAGNOSIS — S46912A Strain of unspecified muscle, fascia and tendon at shoulder and upper arm level, left arm, initial encounter: Secondary | ICD-10-CM

## 2013-01-30 DIAGNOSIS — R51 Headache: Secondary | ICD-10-CM

## 2013-01-30 MED ORDER — HYDROCODONE-ACETAMINOPHEN 5-325 MG PO TABS: 1.0000 | ORAL_TABLET | Freq: Four times a day (QID) | ORAL | Status: DC | PRN

## 2013-01-30 NOTE — Patient Instructions (Signed)
Ice  Aleve  Hydrocodone only for severe pain  Wear sling until it is feeling better.  Return as needed but also let Dr. Clelia Croft recheck you at your physical.
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
## 2013-02-02 ENCOUNTER — Telehealth: Payer: Self-pay | Admitting: Radiology

## 2013-02-02 NOTE — Telephone Encounter (Signed)
[facility] sent a fax requesting a refill for her Lorazepam 0.5 mg. Please advise

## 2013-02-02 NOTE — Telephone Encounter (Signed)
Patient states you called her, French Ana called about CT scan, she states you can call her back at work if you called also. Amy

## 2013-02-07 ENCOUNTER — Telehealth: Payer: Self-pay

## 2013-02-07 MED ORDER — LORAZEPAM 0.5 MG PO TABS: 0.5000 mg | ORAL_TABLET | Freq: Three times a day (TID) | ORAL | Status: DC

## 2013-02-07 NOTE — Telephone Encounter (Signed)
Ok to refill with 10 tabs only. Pt has appt on Friday for a CPE. We can discuss then.  i have only seen her once for an acute visit for HA so will not refill further without discussion about the medicine at an OV as it is a controlled substances which I only refill at OV.  [facility] Policy for Prescribing Controlled Substances (Revised 09/2012) 1. Prescriptions for controlled substances will be filled by ONE provider at [facility] with whom you have established and developed a plan for your care, including follow-up. 2. You are encouraged to schedule an appointment with your prescriber at our appointment center for follow-up visits whenever possible. 3. If you request a prescription for the controlled substance while at [facility] for an acute problem (with someone other than your regular prescriber), you MAY be given a ONE-TIME prescription for a 30-day supply of the controlled substance, to allow time for you to return to see your regular prescriber for additional prescriptions.

## 2013-02-07 NOTE — Telephone Encounter (Signed)
RF req for Lorazepam 0.5 mg, 1 tab Q 8 hrs prn. Dr Clelia Croft, do you want to RF for pt?

## 2013-02-07 NOTE — Telephone Encounter (Signed)
Patient wants to speak with Dr. Clelia Croft directly,   She is upset with the conversation she had with "someone" in our office.    CBN:   [phone]

## 2013-02-07 NOTE — Telephone Encounter (Signed)
See other phone note

## 2013-02-07 NOTE — Telephone Encounter (Signed)
Ms. Orzechowski was speaking with Erin Anderson who was relaying to the patient EXACTLY what I had requested.  If pt did not want to hear my instructions, she may come in for an appointment to speak to me directly.

## 2013-02-07 NOTE — Telephone Encounter (Signed)
Please advise on Lorazepam Rx

## 2013-02-07 NOTE — Telephone Encounter (Signed)
Called this in for patient. She has been advised of our Rx she is very angry on the phone she states I am going to listen to her, she raised her voice on the line and was very argumentative states she has never abused these meds I disconnected the call when the patient raised her voice at me. To you FYI

## 2013-02-07 NOTE — Telephone Encounter (Signed)
Patient may discuss with you when she comes in on Friday

## 2013-02-11 ENCOUNTER — Ambulatory Visit (INDEPENDENT_AMBULATORY_CARE_PROVIDER_SITE_OTHER): Payer: BC Managed Care – PPO | Admitting: Family Medicine

## 2013-02-11 ENCOUNTER — Other Ambulatory Visit: Payer: Self-pay | Admitting: Family Medicine

## 2013-02-11 ENCOUNTER — Encounter: Payer: Self-pay | Admitting: Family Medicine

## 2013-02-11 VITALS — BP 150/110 | HR 66 | Temp 98.0°F | Resp 16 | Ht 64.0 in | Wt 135.2 lb

## 2013-02-11 DIAGNOSIS — N898 Other specified noninflammatory disorders of vagina: Secondary | ICD-10-CM

## 2013-02-11 DIAGNOSIS — Z79899 Other long term (current) drug therapy: Secondary | ICD-10-CM

## 2013-02-11 DIAGNOSIS — Z1231 Encounter for screening mammogram for malignant neoplasm of breast: Secondary | ICD-10-CM

## 2013-02-11 DIAGNOSIS — M79672 Pain in left foot: Secondary | ICD-10-CM

## 2013-02-11 DIAGNOSIS — M25579 Pain in unspecified ankle and joints of unspecified foot: Secondary | ICD-10-CM

## 2013-02-11 DIAGNOSIS — Z0000 Encounter for general adult medical examination without abnormal findings: Secondary | ICD-10-CM

## 2013-02-11 DIAGNOSIS — Z79890 Hormone replacement therapy (postmenopausal): Secondary | ICD-10-CM

## 2013-02-11 DIAGNOSIS — M79609 Pain in unspecified limb: Secondary | ICD-10-CM

## 2013-02-11 DIAGNOSIS — S46912D Strain of unspecified muscle, fascia and tendon at shoulder and upper arm level, left arm, subsequent encounter: Secondary | ICD-10-CM

## 2013-02-11 LAB — POCT WET PREP WITH KOH
Clue Cells Wet Prep HPF POC: NEGATIVE
KOH Prep POC: NEGATIVE
RBC Wet Prep HPF POC: NEGATIVE

## 2013-02-11 LAB — CBC WITH DIFFERENTIAL/PLATELET
Basophils Absolute: 0 10*3/uL (ref 0.0–0.1)
Eosinophils Absolute: 0.2 10*3/uL (ref 0.0–0.7)
Lymphocytes Relative: 30 % (ref 12–46)
Lymphs Abs: 2 10*3/uL (ref 0.7–4.0)
MCH: 28.8 pg (ref 26.0–34.0)
Neutrophils Relative %: 59 % (ref 43–77)
Platelets: 372 10*3/uL (ref 150–400)
RBC: 4.89 MIL/uL (ref 3.87–5.11)
RDW: 16.4 % — ABNORMAL HIGH (ref 11.5–15.5)
WBC: 6.5 10*3/uL (ref 4.0–10.5)

## 2013-02-11 LAB — LIPID PANEL
HDL: 69 mg/dL (ref >39)
Total CHOL/HDL Ratio: 2.6 Ratio
VLDL: 14 mg/dL (ref 0–40)

## 2013-02-11 LAB — COMPREHENSIVE METABOLIC PANEL
ALT: 13 U/L (ref 0–35)
AST: 25 U/L (ref 0–37)
CO2: 29 mEq/L (ref 19–32)
Creat: 0.7 mg/dL (ref 0.50–1.10)
Sodium: 137 mEq/L (ref 135–145)
Total Bilirubin: 0.5 mg/dL (ref 0.3–1.2)
Total Protein: 7.4 g/dL (ref 6.0–8.3)

## 2013-02-11 MED ORDER — OMEPRAZOLE 40 MG PO CPDR: 40.0000 mg | DELAYED_RELEASE_CAPSULE | Freq: Every day | ORAL | Status: DC

## 2013-02-11 MED ORDER — LORAZEPAM 0.5 MG PO TABS: 0.5000 mg | ORAL_TABLET | Freq: Three times a day (TID) | ORAL | Status: DC

## 2013-02-11 MED ORDER — MELOXICAM 15 MG PO TABS: 15.0000 mg | ORAL_TABLET | Freq: Every day | ORAL | Status: DC

## 2013-02-11 NOTE — Patient Instructions (Addendum)
[facility] Policy for Prescribing Controlled Substances (Revised 09/2012) 1. Prescriptions for controlled substances will be filled by ONE provider at [facility] with whom you have established and developed a plan for your care, including follow-up. 2. You are encouraged to schedule an appointment with your prescriber at our appointment center for follow-up visits whenever possible. 3. If you request a prescription for the controlled substance while at [facility] for an acute problem (with someone other than your regular prescriber), you MAY be given a ONE-TIME prescription for a 30-day supply of the controlled substance, to allow time for you to return to see your regular prescriber for additional prescriptions.  Keeping You Healthy  Get These Tests  Blood Pressure- Have your blood pressure checked by your healthcare provider at least once a year.  Normal blood pressure is 120/80.  Weight- Have your body mass index (BMI) calculated to screen for obesity.  BMI is a measure of body fat based on height and weight.  You can calculate your own BMI at https://www.west-esparza.com/  Cholesterol- Have your cholesterol checked every year.  Diabetes- Have your blood sugar checked every year if you have high blood pressure, high cholesterol, a family history of diabetes or if you are overweight.  Pap Smear- Have a pap smear every 1 to 3 years if you have been sexually active.  If you are older than 65 and recent pap smears have been normal you may not need additional pap smears.  In addition, if you have had a hysterectomy  For benign disease additional pap smears are not necessary.  Mammogram-Yearly mammograms are essential for early detection of breast cancer  Screening for Colon Cancer- Colonoscopy starting at age 39. Screening may begin sooner depending on your family history and other health conditions.  Follow up colonoscopy as directed by your Gastroenterologist.  Screening for Osteoporosis- Screening begins at age 92  with bone density scanning, sooner if you are at higher risk for developing Osteoporosis.  Get these medicines  Calcium with Vitamin D- Your body requires 1200-1500 mg of Calcium a day and [phone] IU of Vitamin D a day.  You can only absorb 500 mg of Calcium at a time therefore Calcium must be taken in 2 or 3 separate doses throughout the day.  Hormones- Hormone therapy has been associated with increased risk for certain cancers and heart disease.  Talk to your healthcare provider about if you need relief from menopausal symptoms.  Aspirin- Ask your healthcare provider about taking Aspirin to prevent Heart Disease and Stroke.  Get these Immuniztions  Flu shot- Every fall  Pneumonia shot- Once after the age of 39; if you are younger ask your healthcare provider if you need a pneumonia shot.  Tetanus- Every ten years.  Zostavax- Once after the age of 20 to prevent shingles.  Take these steps  Don't smoke- Your healthcare provider can help you quit. For tips on how to quit, ask your healthcare provider or go to www.smokefree.gov or call 1-800 QUIT-NOW.  Be physically active- Exercise 5 days a week for a minimum of 30 minutes.  If you are not already physically active, start slow and gradually work up to 30 minutes of moderate physical activity.  Try walking, dancing, bike riding, swimming, etc.  Eat a healthy diet- Eat a variety of healthy foods such as fruits, vegetables, whole grains, low fat milk, low fat cheeses, yogurt, lean meats, chicken, fish, eggs, dried beans, tofu, etc.  For more information go to www.thenutritionsource.org  Dental visit- Brush  and floss teeth twice daily; visit your dentist twice a year.  Eye exam- Visit your Optometrist or Ophthalmologist yearly.  Drink alcohol in moderation- Limit alcohol intake to one drink or less a day.  Never drink and drive.  Depression- Your emotional health is as important as your physical health.  If you're feeling down or losing  interest in things you normally enjoy, please talk to your healthcare provider.  Seat Belts- can save your life; always wear one  Smoke/Carbon Monoxide detectors- These detectors need to be installed on the appropriate level of your home.  Replace batteries at least once a year.  Violence- If anyone is threatening or hurting you, please tell your healthcare provider.  Living Will/ Health care power of attorney- Discuss with your healthcare provider and family.
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
## 2013-02-12 LAB — FOLLICLE STIMULATING HORMONE: FSH: 100.3 m[IU]/mL

## 2013-02-14 LAB — ESTRADIOL: Estradiol: 31.8 pg/mL

## 2013-02-15 LAB — PAP IG, CT-NG NAA, HPV HIGH-RISK
Chlamydia Probe Amp: NEGATIVE
GC Probe Amp: NEGATIVE
HPV DNA High Risk: NOT DETECTED

## 2013-03-11 ENCOUNTER — Ambulatory Visit: Payer: BC Managed Care – PPO

## 2013-03-12 ENCOUNTER — Telehealth: Payer: Self-pay
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
## 2013-03-13 MED ORDER — SERTRALINE HCL 100 MG PO TABS: 100.0000 mg | ORAL_TABLET | Freq: Every day | ORAL | Status: DC

## 2013-03-13 NOTE — Telephone Encounter (Signed)
Yes, that's fine - refill sertraline 100mg  1 po qd disp 90, 1 refill.

## 2013-03-13 NOTE — Telephone Encounter (Signed)
Pt notified that rx was sent in to pharmacy

## 2013-03-17 ENCOUNTER — Ambulatory Visit: Payer: BC Managed Care – PPO

## 2013-04-08 ENCOUNTER — Telehealth: Payer: Self-pay

## 2013-04-08 DIAGNOSIS — R51 Headache: Secondary | ICD-10-CM

## 2013-04-08 DIAGNOSIS — J309 Allergic rhinitis, unspecified: Secondary | ICD-10-CM

## 2013-04-08 NOTE — Telephone Encounter (Signed)
Pharm requests RF of But/APAP/Caf cap #30. Dr Clelia Croft, I don't see where you have Rxd this for pt before but you did discuss her HAs at March OV. Do you want to send in a Rx?

## 2013-04-11 NOTE — Telephone Encounter (Signed)
Pharm sent a 2nd request for this RF.

## 2013-04-12 MED ORDER — BUTALBITAL-APAP-CAFFEINE 50-325-40 MG PO TABS: 1.0000 | ORAL_TABLET | Freq: Four times a day (QID) | ORAL | Status: DC | PRN

## 2013-04-12 NOTE — Telephone Encounter (Signed)
Thanks, done.

## 2013-04-12 NOTE — Telephone Encounter (Signed)
Yes, ok to call in 1 refill.  Pt had been referred to neurology headache clinic for eval - her appt was in April - did not get consult note back (at least nothing in chart).  Did she go? What was the assessment and treatment plan?

## 2013-04-12 NOTE — Telephone Encounter (Signed)
Yes please and thank you. Pt should schedule appt w/ me to discuss and fine to place allergist referral for "sinus headaches."

## 2013-04-12 NOTE — Telephone Encounter (Signed)
Printed. I called patient about the headache clinic and she did go there, states she was not happy about the visit and wants to discuss with you. The Neurologist recommended she d/c ALL her medications, and she was not happy about this advise, she was put on Topamax , but did not want to take this medication. She wants to know if she can see an allergist/patient indicates she thinks allergies are causing her headaches,  I pended this referral. She was transferred to schedule an appt with you to discuss all this. Will take a while to get allergist appt, so I told her we could probably go ahead and start on the referral.

## 2013-04-22 ENCOUNTER — Ambulatory Visit: Payer: BC Managed Care – PPO | Admitting: Family Medicine

## 2013-04-29 ENCOUNTER — Ambulatory Visit: Payer: BC Managed Care – PPO | Admitting: Family Medicine

## 2013-05-05 ENCOUNTER — Ambulatory Visit (INDEPENDENT_AMBULATORY_CARE_PROVIDER_SITE_OTHER): Payer: BC Managed Care – PPO | Admitting: Family Medicine

## 2013-05-05 VITALS — BP 142/86 | HR 62 | Temp 97.9°F | Resp 16 | Ht 64.0 in | Wt 138.0 lb

## 2013-05-05 DIAGNOSIS — M7542 Impingement syndrome of left shoulder: Secondary | ICD-10-CM

## 2013-05-05 DIAGNOSIS — G43909 Migraine, unspecified, not intractable, without status migrainosus: Secondary | ICD-10-CM

## 2013-05-05 DIAGNOSIS — M5432 Sciatica, left side: Secondary | ICD-10-CM

## 2013-05-05 DIAGNOSIS — R519 Headache, unspecified: Secondary | ICD-10-CM

## 2013-05-05 DIAGNOSIS — E039 Hypothyroidism, unspecified: Secondary | ICD-10-CM

## 2013-05-05 DIAGNOSIS — M25819 Other specified joint disorders, unspecified shoulder: Secondary | ICD-10-CM

## 2013-05-05 DIAGNOSIS — M5430 Sciatica, unspecified side: Secondary | ICD-10-CM

## 2013-05-05 DIAGNOSIS — R51 Headache: Secondary | ICD-10-CM

## 2013-05-05 DIAGNOSIS — K219 Gastro-esophageal reflux disease without esophagitis: Secondary | ICD-10-CM

## 2013-05-05 MED ORDER — LORAZEPAM 0.5 MG PO TABS: 0.5000 mg | ORAL_TABLET | Freq: Three times a day (TID) | ORAL | Status: DC

## 2013-05-05 MED ORDER — BUTALBITAL-APAP-CAFFEINE 50-325-40 MG PO TABS: 1.0000 | ORAL_TABLET | Freq: Four times a day (QID) | ORAL | Status: DC | PRN

## 2013-05-05 MED ORDER — TRAMADOL HCL 50 MG PO TABS: 50.0000 mg | ORAL_TABLET | Freq: Three times a day (TID) | ORAL | Status: DC | PRN

## 2013-05-05 MED ORDER — DICLOFENAC SODIUM 1 % TD GEL: 4.0000 g | Freq: Four times a day (QID) | TRANSDERMAL | Status: DC

## 2013-05-05 NOTE — Patient Instructions (Signed)
Impingement Syndrome, Rotator Cuff, Bursitis with Rehab Impingement syndrome is a condition that involves inflammation of the tendons of the rotator cuff and the subacromial bursa, that causes pain in the shoulder. The rotator cuff consists of four tendons and muscles that control much of the shoulder and upper arm function. The subacromial bursa is a fluid filled sac that helps reduce friction between the rotator cuff and one of the bones of the shoulder (acromion). Impingement syndrome is usually an overuse injury that causes swelling of the bursa (bursitis), swelling of the tendon (tendonitis), and/or a tear of the tendon (strain). Strains are classified into three categories. Grade 1 strains cause pain, but the tendon is not lengthened. Grade 2 strains include a lengthened ligament, due to the ligament being stretched or partially ruptured. With grade 2 strains there is still function, although the function may be decreased. Grade 3 strains include a complete tear of the tendon or muscle, and function is usually impaired. SYMPTOMS   Pain around the shoulder, often at the outer portion of the upper arm.  Pain that gets worse with shoulder function, especially when reaching overhead or lifting.  Sometimes, aching when not using the arm.  Pain that wakes you up at night.  Sometimes, tenderness, swelling, warmth, or redness over the affected area.  Loss of strength.  Limited motion of the shoulder, especially reaching behind the back (to the back pocket or to unhook bra) or across your body.  Crackling sound (crepitation) when moving the arm.  Biceps tendon pain and inflammation (in the front of the shoulder). Worse when bending the elbow or lifting. CAUSES  Impingement syndrome is often an overuse injury, in which chronic (repetitive) motions cause the tendons or bursa to become inflamed. A strain occurs when a force is paced on the tendon or muscle that is greater than it can withstand.  Common mechanisms of injury include: Stress from sudden increase in duration, frequency, or intensity of training.  Direct hit (trauma) to the shoulder.  Aging, erosion of the tendon with normal use.  Bony bump on shoulder (acromial spur). RISK INCREASES WITH:  Contact sports (football, wrestling, boxing).  Throwing sports (baseball, tennis, volleyball).  Weightlifting and bodybuilding.  Heavy labor.  Previous injury to the rotator cuff, including impingement.  Poor shoulder strength and flexibility.  Failure to warm up properly before activity.  Inadequate protective equipment.  Old age.  Bony bump on shoulder (acromial spur). PREVENTION   Warm up and stretch properly before activity.  Allow for adequate recovery between workouts.  Maintain physical fitness:  Strength, flexibility, and endurance.  Cardiovascular fitness.  Learn and use proper exercise technique. PROGNOSIS  If treated properly, impingement syndrome usually goes away within 6 weeks. Sometimes surgery is required.  RELATED COMPLICATIONS   Longer healing time if not properly treated, or if not given enough time to heal.  Recurring symptoms, that result in a chronic condition.  Shoulder stiffness, frozen shoulder, or loss of motion.  Rotator cuff tendon tear.  Recurring symptoms, especially if activity is resumed too soon, with overuse, with a direct blow, or when using poor technique. TREATMENT  Treatment first involves the use of ice and medicine, to reduce pain and inflammation. The use of strengthening and stretching exercises may help reduce pain with activity. These exercises may be performed at home or with a therapist. If non-surgical treatment is unsuccessful after more than 6 months, surgery may be advised. After surgery and rehabilitation, activity is usually possible in 3 months.  MEDICATION  If pain medicine is needed, nonsteroidal anti-inflammatory medicines (aspirin and  ibuprofen), or other minor pain relievers (acetaminophen), are often advised.  Do not take pain medicine for 7 days before surgery.  Prescription pain relievers may be given, if your caregiver thinks they are needed. Use only as directed and only as much as you need.  Corticosteroid injections may be given by your caregiver. These injections should be reserved for the most serious cases, because they may only be given a certain number of times. HEAT AND COLD  Cold treatment (icing) should be applied for 10 to 15 minutes every 2 to 3 hours for inflammation and pain, and immediately after activity that aggravates your symptoms. Use ice packs or an ice massage.  Heat treatment may be used before performing stretching and strengthening activities prescribed by your caregiver, physical therapist, or athletic trainer. Use a heat pack or a warm water soak. SEEK MEDICAL CARE IF:   Symptoms get worse or do not improve in 4 to 6 weeks, despite treatment.  New, unexplained symptoms develop. (Drugs used in treatment may produce side effects.) EXERCISES  RANGE OF MOTION (ROM) AND STRETCHING EXERCISES - Impingement Syndrome (Rotator Cuff  Tendinitis, Bursitis) These exercises may help you when beginning to rehabilitate your injury. Your symptoms may go away with or without further involvement from your physician, physical therapist or athletic trainer. While completing these exercises, remember:   Restoring tissue flexibility helps normal motion to return to the joints. This allows healthier, less painful movement and activity.  An effective stretch should be held for at least 30 seconds.  A stretch should never be painful. You should only feel a gentle lengthening or release in the stretched tissue. STRETCH  Flexion, Standing  Stand with good posture. With an underhand grip on your right / left hand, and an overhand grip on the opposite hand, grasp a broomstick or cane so that your hands are a little  more than shoulder width apart.  Keeping your right / left elbow straight and shoulder muscles relaxed, push the stick with your opposite hand, to raise your right / left arm in front of your body and then overhead. Raise your arm until you feel a stretch in your right / left shoulder, but before you have increased shoulder pain.  Try to avoid shrugging your right / left shoulder as your arm rises, by keeping your shoulder blade tucked down and toward your mid-back spine. Hold for __________ seconds.  Slowly return to the starting position. Repeat __________ times. Complete this exercise __________ times per day. STRETCH  Abduction, Supine  Lie on your back. With an underhand grip on your right / left hand and an overhand grip on the opposite hand, grasp a broomstick or cane so that your hands are a little more than shoulder width apart.  Keeping your right / left elbow straight and your shoulder muscles relaxed, push the stick with your opposite hand, to raise your right / left arm out to the side of your body and then overhead. Raise your arm until you feel a stretch in your right / left shoulder, but before you have increased shoulder pain.  Try to avoid shrugging your right / left shoulder as your arm rises, by keeping your shoulder blade tucked down and toward your mid-back spine. Hold for __________ seconds.  Slowly return to the starting position. Repeat __________ times. Complete this exercise __________ times per day. ROM  Flexion, Active-Assisted  Lie on your back.  You may bend your knees for comfort.  Grasp a broomstick or cane so your hands are about shoulder width apart. Your right / left hand should grip the end of the stick, so that your hand is positioned "thumbs-up," as if you were about to shake hands.  Using your healthy arm to lead, raise your right / left arm overhead, until you feel a gentle stretch in your shoulder. Hold for __________ seconds.  Use the stick to  assist in returning your right / left arm to its starting position. Repeat __________ times. Complete this exercise __________ times per day.  ROM - Internal Rotation, Supine   Lie on your back on a firm surface. Place your right / left elbow about 60 degrees away from your side. Elevate your elbow with a folded towel, so that the elbow and shoulder are the same height.  Using a broomstick or cane and your strong arm, pull your right / left hand toward your body until you feel a gentle stretch, but no increase in your shoulder pain. Keep your shoulder and elbow in place throughout the exercise.  Hold for __________ seconds. Slowly return to the starting position. Repeat __________ times. Complete this exercise __________ times per day. STRETCH - Internal Rotation  Place your right / left hand behind your back, palm up.  Throw a towel or belt over your opposite shoulder. Grasp the towel with your right / left hand.  While keeping an upright posture, gently pull up on the towel, until you feel a stretch in the front of your right / left shoulder.  Avoid shrugging your right / left shoulder as your arm rises, by keeping your shoulder blade tucked down and toward your mid-back spine.  Hold for __________ seconds. Release the stretch, by lowering your healthy hand. Repeat __________ times. Complete this exercise __________ times per day. ROM - Internal Rotation   Using an underhand grip, grasp a stick behind your back with both hands.  While standing upright with good posture, slide the stick up your back until you feel a mild stretch in the front of your shoulder.  Hold for __________ seconds. Slowly return to your starting position. Repeat __________ times. Complete this exercise __________ times per day.  STRETCH  Posterior Shoulder Capsule   Stand or sit with good posture. Grasp your right / left elbow and draw it across your chest, keeping it at the same height as your  shoulder.  Pull your elbow, so your upper arm comes in closer to your chest. Pull until you feel a gentle stretch in the back of your shoulder.  Hold for __________ seconds. Repeat __________ times. Complete this exercise __________ times per day. STRENGTHENING EXERCISES - Impingement Syndrome (Rotator Cuff Tendinitis, Bursitis) These exercises may help you when beginning to rehabilitate your injury. They may resolve your symptoms with or without further involvement from your physician, physical therapist or athletic trainer. While completing these exercises, remember:  Muscles can gain both the endurance and the strength needed for everyday activities through controlled exercises.  Complete these exercises as instructed by your physician, physical therapist or athletic trainer. Increase the resistance and repetitions only as guided.  You may experience muscle soreness or fatigue, but the pain or discomfort you are trying to eliminate should never worsen during these exercises. If this pain does get worse, stop and make sure you are following the directions exactly. If the pain is still present after adjustments, discontinue the exercise until you can discuss  the trouble with your clinician.  During your recovery, avoid activity or exercises which involve actions that place your injured hand or elbow above your head or behind your back or head. These positions stress the tissues which you are trying to heal. STRENGTH - Scapular Depression and Adduction   With good posture, sit on a firm chair. Support your arms in front of you, with pillows, arm rests, or on a table top. Have your elbows in line with the sides of your body.  Gently draw your shoulder blades down and toward your mid-back spine. Gradually increase the tension, without tensing the muscles along the top of your shoulders and the back of your neck.  Hold for __________ seconds. Slowly release the tension and relax your muscles  completely before starting the next repetition.  After you have practiced this exercise, remove the arm support and complete the exercise in standing as well as sitting position. Repeat __________ times. Complete this exercise __________ times per day.  STRENGTH - Shoulder Abductors, Isometric  With good posture, stand or sit about 4-6 inches from a wall, with your right / left side facing the wall.  Bend your right / left elbow. Gently press your right / left elbow into the wall. Increase the pressure gradually, until you are pressing as hard as you can, without shrugging your shoulder or increasing any shoulder discomfort.  Hold for __________ seconds.  Release the tension slowly. Relax your shoulder muscles completely before you begin the next repetition. Repeat __________ times. Complete this exercise __________ times per day.  STRENGTH - External Rotators, Isometric  Keep your right / left elbow at your side and bend it 90 degrees.  Step into a door frame so that the outside of your right / left wrist can press against the door frame without your upper arm leaving your side.  Gently press your right / left wrist into the door frame, as if you were trying to swing the back of your hand away from your stomach. Gradually increase the tension, until you are pressing as hard as you can, without shrugging your shoulder or increasing any shoulder discomfort.  Hold for __________ seconds.  Release the tension slowly. Relax your shoulder muscles completely before you begin the next repetition. Repeat __________ times. Complete this exercise __________ times per day.  STRENGTH - Supraspinatus   Stand or sit with good posture. Grasp a __________ weight, or an exercise band or tubing, so that your hand is "thumbs-up," like you are shaking hands.  Slowly lift your right / left arm in a "V" away from your thigh, diagonally into the space between your side and straight ahead. Lift your hand to  shoulder height or as far as you can, without increasing any shoulder pain. At first, many people do not lift their hands above shoulder height.  Avoid shrugging your right / left shoulder as your arm rises, by keeping your shoulder blade tucked down and toward your mid-back spine.  Hold for __________ seconds. Control the descent of your hand, as you slowly return to your starting position. Repeat __________ times. Complete this exercise __________ times per day.  STRENGTH - External Rotators  Secure a rubber exercise band or tubing to a fixed object (table, pole) so that it is at the same height as your right / left elbow when you are standing or sitting on a firm surface.  Stand or sit so that the secured exercise band is at your uninjured side.  Bend   your right / left elbow 90 degrees. Place a folded towel or small pillow under your right / left arm, so that your elbow is a few inches away from your side.  Keeping the tension on the exercise band, pull it away from your body, as if pivoting on your elbow. Be sure to keep your body steady, so that the movement is coming only from your rotating shoulder.  Hold for __________ seconds. Release the tension in a controlled manner, as you return to the starting position. Repeat __________ times. Complete this exercise __________ times per day.  STRENGTH - Internal Rotators   Secure a rubber exercise band or tubing to a fixed object (table, pole) so that it is at the same height as your right / left elbow when you are standing or sitting on a firm surface.  Stand or sit so that the secured exercise band is at your right / left side.  Bend your elbow 90 degrees. Place a folded towel or small pillow under your right / left arm so that your elbow is a few inches away from your side.  Keeping the tension on the exercise band, pull it across your body, toward your stomach. Be sure to keep your body steady, so that the movement is coming only from  your rotating shoulder.  Hold for __________ seconds. Release the tension in a controlled manner, as you return to the starting position. Repeat __________ times. Complete this exercise __________ times per day.  STRENGTH - Scapular Protractors, Standing   Stand arms length away from a wall. Place your hands on the wall, keeping your elbows straight.  Begin by dropping your shoulder blades down and toward your mid-back spine.  To strengthen your protractors, keep your shoulder blades down, but slide them forward on your rib cage. It will feel as if you are lifting the back of your rib cage away from the wall. This is a subtle motion and can be challenging to complete. Ask your caregiver for further instruction, if you are not sure you are doing the exercise correctly.  Hold for __________ seconds. Slowly return to the starting position, resting the muscles completely before starting the next repetition. Repeat __________ times. Complete this exercise __________ times per day. STRENGTH - Scapular Protractors, Supine  Lie on your back on a firm surface. Extend your right / left arm straight into the air while holding a __________ weight in your hand.  Keeping your head and back in place, lift your shoulder off the floor.  Hold for __________ seconds. Slowly return to the starting position, and allow your muscles to relax completely before starting the next repetition. Repeat __________ times. Complete this exercise __________ times per day. STRENGTH - Scapular Protractors, Quadruped  Get onto your hands and knees, with your shoulders directly over your hands (or as close as you can be, comfortably).  Keeping your elbows locked, lift the back of your rib cage up into your shoulder blades, so your mid-back rounds out. Keep your neck muscles relaxed.  Hold this position for __________ seconds. Slowly return to the starting position and allow your muscles to relax completely before starting the  next repetition. Repeat __________ times. Complete this exercise __________ times per day.  STRENGTH - Scapular Retractors  Secure a rubber exercise band or tubing to a fixed object (table, pole), so that it is at the height of your shoulders when you are either standing, or sitting on a firm armless chair.  With a   palm down grip, grasp an end of the band in each hand. Straighten your elbows and lift your hands straight in front of you, at shoulder height. Step back, away from the secured end of the band, until it becomes tense.  Squeezing your shoulder blades together, draw your elbows back toward your sides, as you bend them. Keep your upper arms lifted away from your body throughout the exercise.  Hold for __________ seconds. Slowly ease the tension on the band, as you reverse the directions and return to the starting position. Repeat __________ times. Complete this exercise __________ times per day. STRENGTH - Shoulder Extensors   Secure a rubber exercise band or tubing to a fixed object (table, pole) so that it is at the height of your shoulders when you are either standing, or sitting on a firm armless chair.  With a thumbs-up grip, grasp an end of the band in each hand. Straighten your elbows and lift your hands straight in front of you, at shoulder height. Step back, away from the secured end of the band, until it becomes tense.  Squeezing your shoulder blades together, pull your hands down to the sides of your thighs. Do not allow your hands to go behind you.  Hold for __________ seconds. Slowly ease the tension on the band, as you reverse the directions and return to the starting position. Repeat __________ times. Complete this exercise __________ times per day.  STRENGTH - Scapular Retractors and External Rotators   Secure a rubber exercise band or tubing to a fixed object (table, pole) so that it is at the height as your shoulders, when you are either standing, or sitting on a  firm armless chair.  With a palm down grip, grasp an end of the band in each hand. Bend your elbows 90 degrees and lift your elbows to shoulder height, at your sides. Step back, away from the secured end of the band, until it becomes tense.  Squeezing your shoulder blades together, rotate your shoulders so that your upper arms and elbows remain stationary, but your fists travel upward to head height.  Hold for __________ seconds. Slowly ease the tension on the band, as you reverse the directions and return to the starting position. Repeat __________ times. Complete this exercise __________ times per day.  STRENGTH - Scapular Retractors and External Rotators, Rowing   Secure a rubber exercise band or tubing to a fixed object (table, pole) so that it is at the height of your shoulders, when you are either standing, or sitting on a firm armless chair.  With a palm down grip, grasp an end of the band in each hand. Straighten your elbows and lift your hands straight in front of you, at shoulder height. Step back, away from the secured end of the band, until it becomes tense.  Step 1: Squeeze your shoulder blades together. Bending your elbows, draw your hands to your chest, as if you are rowing a boat. At the end of this motion, your hands and elbow should be at shoulder height and your elbows should be out to your sides.  Step 2: Rotate your shoulders, to raise your hands above your head. Your forearms should be vertical and your upper arms should be horizontal.  Hold for __________ seconds. Slowly ease the tension on the band, as you reverse the directions and return to the starting position. Repeat __________ times. Complete this exercise __________ times per day.  STRENGTH  Scapular Depressors  Find a sturdy chair  without wheels, such as a dining room chair.  Keeping your feet on the floor, and your hands on the chair arms, lift your bottom up from the seat, and lock your elbows.  Keeping  your elbows straight, allow gravity to pull your body weight down. Your shoulders will rise toward your ears.  Raise your body against gravity by drawing your shoulder blades down your back, shortening the distance between your shoulders and ears. Although your feet should always maintain contact with the floor, your feet should progressively support less body weight, as you get stronger.  Hold for __________ seconds. In a controlled and slow manner, lower your body weight to begin the next repetition. Repeat __________ times. Complete this exercise __________ times per day.  Document Released: 11/10/2005 Document Revised: 02/02/2012 Document Reviewed: 02/22/2009 [facility] Patient Information 2014 [place], [place]. Sciatica with Rehab The sciatic nerve runs from the back down the leg and is responsible for sensation and control of the muscles in the back (posterior) side of the thigh, lower leg, and foot. Sciatica is a condition that is characterized by inflammation of this nerve.  SYMPTOMS   Signs of nerve damage, including numbness and/or weakness along the posterior side of the lower extremity.  Pain in the back of the thigh that may also travel down the leg.  Pain that worsens when sitting for long periods of time.  Occasionally, pain in the back or buttock. CAUSES  Inflammation of the sciatic nerve is the cause of sciatica. The inflammation is due to something irritating the nerve. Common sources of irritation include:  Sitting for long periods of time.  Direct trauma to the nerve.  Arthritis of the spine.  Herniated or ruptured disk.  Slipping of the vertebrae (spondylolithesis)  Pressure from soft tissues, such as muscles or ligament-like tissue (fascia). RISK INCREASES WITH:  Sports that place pressure or stress on the spine (football or weightlifting).  Poor strength and flexibility.  Failure to warm-up properly before activity.  Family history of low back pain or  disk disorders.  Previous back injury or surgery.  Poor body mechanics, especially when lifting, or poor posture. PREVENTION   Warm up and stretch properly before activity.  Maintain physical fitness:  Strength, flexibility, and endurance.  Cardiovascular fitness.  Learn and use proper technique, especially with posture and lifting. When possible, have coach correct improper technique.  Avoid activities that place stress on the spine. PROGNOSIS If treated properly, then sciatica usually resolves within 6 weeks. However, occasionally surgery is necessary.  RELATED COMPLICATIONS   Permanent nerve damage, including pain, numbness, tingle, or weakness.  Chronic back pain.  Risks of surgery: infection, bleeding, nerve damage, or damage to surrounding tissues. TREATMENT Treatment initially involves resting from any activities that aggravate your symptoms. The use of ice and medication may help reduce pain and inflammation. The use of strengthening and stretching exercises may help reduce pain with activity. These exercises may be performed at home or with referral to a therapist. A therapist may recommend further treatments, such as transcutaneous electronic nerve stimulation (TENS) or ultrasound. Your caregiver may recommend corticosteroid injections to help reduce inflammation of the sciatic nerve. If symptoms persist despite non-surgical (conservative) treatment, then surgery may be recommended. MEDICATION  If pain medication is necessary, then nonsteroidal anti-inflammatory medications, such as aspirin and ibuprofen, or other minor pain relievers, such as acetaminophen, are often recommended.  Do not take pain medication for 7 days before surgery.  Prescription pain relievers may be given if  deemed necessary by your caregiver. Use only as directed and only as much as you need.  Ointments applied to the skin may be helpful.  Corticosteroid injections may be given by your  caregiver. These injections should be reserved for the most serious cases, because they may only be given a certain number of times. HEAT AND COLD  Cold treatment (icing) relieves pain and reduces inflammation. Cold treatment should be applied for 10 to 15 minutes every 2 to 3 hours for inflammation and pain and immediately after any activity that aggravates your symptoms. Use ice packs or massage the area with a piece of ice (ice massage).  Heat treatment may be used prior to performing the stretching and strengthening activities prescribed by your caregiver, physical therapist, or athletic trainer. Use a heat pack or soak the injury in warm water. SEEK MEDICAL CARE IF:  Treatment seems to offer no benefit, or the condition worsens.  Any medications produce adverse side effects. EXERCISES  RANGE OF MOTION (ROM) AND STRETCHING EXERCISES - Sciatica Most people with sciatic will find that their symptoms worsen with either excessive bending forward (flexion) or arching at the low back (extension). The exercises which will help resolve your symptoms will focus on the opposite motion. Your physician, physical therapist or athletic trainer will help you determine which exercises will be most helpful to resolve your low back pain. Do not complete any exercises without first consulting with your clinician. Discontinue any exercises which worsen your symptoms until you speak to your clinician. If you have pain, numbness or tingling which travels down into your buttocks, leg or foot, the goal of the therapy is for these symptoms to move closer to your back and eventually resolve. Occasionally, these leg symptoms will get better, but your low back pain may worsen; this is typically an indication of progress in your rehabilitation. Be certain to be very alert to any changes in your symptoms and the activities in which you participated in the 24 hours prior to the change. Sharing this information with your clinician  will allow him/her to most efficiently treat your condition. These exercises may help you when beginning to rehabilitate your injury. Your symptoms may resolve with or without further involvement from your physician, physical therapist or athletic trainer. While completing these exercises, remember:   Restoring tissue flexibility helps normal motion to return to the joints. This allows healthier, less painful movement and activity.  An effective stretch should be held for at least 30 seconds.  A stretch should never be painful. You should only feel a gentle lengthening or release in the stretched tissue. FLEXION RANGE OF MOTION AND STRETCHING EXERCISES: STRETCH  Flexion, Single Knee to Chest   Lie on a firm bed or floor with both legs extended in front of you.  Keeping one leg in contact with the floor, bring your opposite knee to your chest. Hold your leg in place by either grabbing behind your thigh or at your knee.  Pull until you feel a gentle stretch in your low back. Hold __________ seconds.  Slowly release your grasp and repeat the exercise with the opposite side. Repeat __________ times. Complete this exercise __________ times per day.  STRETCH  Flexion, Double Knee to Chest  Lie on a firm bed or floor with both legs extended in front of you.  Keeping one leg in contact with the floor, bring your opposite knee to your chest.  Tense your stomach muscles to support your back and then lift  your other knee to your chest. Hold your legs in place by either grabbing behind your thighs or at your knees.  Pull both knees toward your chest until you feel a gentle stretch in your low back. Hold __________ seconds.  Tense your stomach muscles and slowly return one leg at a time to the floor. Repeat __________ times. Complete this exercise __________ times per day.  STRETCH  Low Trunk Rotation   Lie on a firm bed or floor. Keeping your legs in front of you, bend your knees so they are  both pointed toward the ceiling and your feet are flat on the floor.  Extend your arms out to the side. This will stabilize your upper body by keeping your shoulders in contact with the floor.  Gently and slowly drop both knees together to one side until you feel a gentle stretch in your low back. Hold for __________ seconds.  Tense your stomach muscles to support your low back as you bring your knees back to the starting position. Repeat the exercise to the other side. Repeat __________ times. Complete this exercise __________ times per day  EXTENSION RANGE OF MOTION AND FLEXIBILITY EXERCISES: STRETCH  Extension, Prone on Elbows  Lie on your stomach on the floor, a bed will be too soft. Place your palms about shoulder width apart and at the height of your head.  Place your elbows under your shoulders. If this is too painful, stack pillows under your chest.  Allow your body to relax so that your hips drop lower and make contact more completely with the floor.  Hold this position for __________ seconds.  Slowly return to lying flat on the floor. Repeat __________ times. Complete this exercise __________ times per day.  RANGE OF MOTION  Extension, Prone Press Ups  Lie on your stomach on the floor, a bed will be too soft. Place your palms about shoulder width apart and at the height of your head.  Keeping your back as relaxed as possible, slowly straighten your elbows while keeping your hips on the floor. You may adjust the placement of your hands to maximize your comfort. As you gain motion, your hands will come more underneath your shoulders.  Hold this position __________ seconds.  Slowly return to lying flat on the floor. Repeat __________ times. Complete this exercise __________ times per day.  STRENGTHENING EXERCISES - Sciatica  These exercises may help you when beginning to rehabilitate your injury. These exercises should be done near your "sweet spot." This is the neutral,  low-back arch, somewhere between fully rounded and fully arched, that is your least painful position. When performed in this safe range of motion, these exercises can be used for people who have either a flexion or extension based injury. These exercises may resolve your symptoms with or without further involvement from your physician, physical therapist or athletic trainer. While completing these exercises, remember:   Muscles can gain both the endurance and the strength needed for everyday activities through controlled exercises.  Complete these exercises as instructed by your physician, physical therapist or athletic trainer. Progress with the resistance and repetition exercises only as your caregiver advises.  You may experience muscle soreness or fatigue, but the pain or discomfort you are trying to eliminate should never worsen during these exercises. If this pain does worsen, stop and make certain you are following the directions exactly. If the pain is still present after adjustments, discontinue the exercise until you can discuss the trouble with your  clinician. STRENGTHENING Deep Abdominals, Pelvic Tilt   Lie on a firm bed or floor. Keeping your legs in front of you, bend your knees so they are both pointed toward the ceiling and your feet are flat on the floor.  Tense your lower abdominal muscles to press your low back into the floor. This motion will rotate your pelvis so that your tail bone is scooping upwards rather than pointing at your feet or into the floor.  With a gentle tension and even breathing, hold this position for __________ seconds. Repeat __________ times. Complete this exercise __________ times per day.  STRENGTHENING  Abdominals, Crunches   Lie on a firm bed or floor. Keeping your legs in front of you, bend your knees so they are both pointed toward the ceiling and your feet are flat on the floor. Cross your arms over your chest.  Slightly tip your chin down without  bending your neck.  Tense your abdominals and slowly lift your trunk high enough to just clear your shoulder blades. Lifting higher can put excessive stress on the low back and does not further strengthen your abdominal muscles.  Control your return to the starting position. Repeat __________ times. Complete this exercise __________ times per day.  STRENGTHENING  Quadruped, Opposite UE/LE Lift  Assume a hands and knees position on a firm surface. Keep your hands under your shoulders and your knees under your hips. You may place padding under your knees for comfort.  Find your neutral spine and gently tense your abdominal muscles so that you can maintain this position. Your shoulders and hips should form a rectangle that is parallel with the floor and is not twisted.  Keeping your trunk steady, lift your right hand no higher than your shoulder and then your left leg no higher than your hip. Make sure you are not holding your breath. Hold this position __________ seconds.  Continuing to keep your abdominal muscles tense and your back steady, slowly return to your starting position. Repeat with the opposite arm and leg. Repeat __________ times. Complete this exercise __________ times per day.  STRENGTHENING  Abdominals and Quadriceps, Straight Leg Raise   Lie on a firm bed or floor with both legs extended in front of you.  Keeping one leg in contact with the floor, bend the other knee so that your foot can rest flat on the floor.  Find your neutral spine, and tense your abdominal muscles to maintain your spinal position throughout the exercise.  Slowly lift your straight leg off the floor about 6 inches for a count of 15, making sure to not hold your breath.  Still keeping your neutral spine, slowly lower your leg all the way to the floor. Repeat this exercise with each leg __________ times. Complete this exercise __________ times per day. POSTURE AND BODY MECHANICS CONSIDERATIONS -  Sciatica Keeping correct posture when sitting, standing or completing your activities will reduce the stress put on different body tissues, allowing injured tissues a chance to heal and limiting painful experiences. The following are general guidelines for improved posture. Your physician or physical therapist will provide you with any instructions specific to your needs. While reading these guidelines, remember:  The exercises prescribed by your provider will help you have the flexibility and strength to maintain correct postures.  The correct posture provides the optimal environment for your joints to work. All of your joints have less wear and tear when properly supported by a spine with good posture. This means  you will experience a healthier, less painful body.  Correct posture must be practiced with all of your activities, especially prolonged sitting and standing. Correct posture is as important when doing repetitive low-stress activities (typing) as it is when doing a single heavy-load activity (lifting). RESTING POSITIONS Consider which positions are most painful for you when choosing a resting position. If you have pain with flexion-based activities (sitting, bending, stooping, squatting), choose a position that allows you to rest in a less flexed posture. You would want to avoid curling into a fetal position on your side. If your pain worsens with extension-based activities (prolonged standing, working overhead), avoid resting in an extended position such as sleeping on your stomach. Most people will find more comfort when they rest with their spine in a more neutral position, neither too rounded nor too arched. Lying on a non-sagging bed on your side with a pillow between your knees, or on your back with a pillow under your knees will often provide some relief. Keep in mind, being in any one position for a prolonged period of time, no matter how correct your posture, can still lead to  stiffness. PROPER SITTING POSTURE In order to minimize stress and discomfort on your spine, you must sit with correct posture Sitting with good posture should be effortless for a healthy body. Returning to good posture is a gradual process. Many people can work toward this most comfortably by using various supports until they have the flexibility and strength to maintain this posture on their own. When sitting with proper posture, your ears will fall over your shoulders and your shoulders will fall over your hips. You should use the back of the chair to support your upper back. Your low back will be in a neutral position, just slightly arched. You may place a small pillow or folded towel at the base of your low back for support.  When working at a desk, create an environment that supports good, upright posture. Without extra support, muscles fatigue and lead to excessive strain on joints and other tissues. Keep these recommendations in mind: CHAIR:   A chair should be able to slide under your desk when your back makes contact with the back of the chair. This allows you to work closely.  The chair's height should allow your eyes to be level with the upper part of your monitor and your hands to be slightly lower than your elbows. BODY POSITION  Your feet should make contact with the floor. If this is not possible, use a foot rest.  Keep your ears over your shoulders. This will reduce stress on your neck and low back. INCORRECT SITTING POSTURES   If you are feeling tired and unable to assume a healthy sitting posture, do not slouch or slump. This puts excessive strain on your back tissues, causing more damage and pain. Healthier options include:  Using more support, like a lumbar pillow.  Switching tasks to something that requires you to be upright or walking.  Talking a brief walk.  Lying down to rest in a neutral-spine position. PROLONGED STANDING WHILE SLIGHTLY LEANING FORWARD  When  completing a task that requires you to lean forward while standing in one place for a long time, place either foot up on a stationary 2-4 inch high object to help maintain the best posture. When both feet are on the ground, the low back tends to lose its slight inward curve. If this curve flattens (or becomes too large), then the back  and your other joints will experience too much stress, fatigue more quickly and can cause pain.  CORRECT STANDING POSTURES Proper standing posture should be assumed with all daily activities, even if they only take a few moments, like when brushing your teeth. As in sitting, your ears should fall over your shoulders and your shoulders should fall over your hips. You should keep a slight tension in your abdominal muscles to brace your spine. Your tailbone should point down to the ground, not behind your body, resulting in an over-extended swayback posture.  INCORRECT STANDING POSTURES  Common incorrect standing postures include a forward head, locked knees and/or an excessive swayback. WALKING Walk with an upright posture. Your ears, shoulders and hips should all line-up. PROLONGED ACTIVITY IN A FLEXED POSITION When completing a task that requires you to bend forward at your waist or lean over a low surface, try to find a way to stabilize 3 of 4 of your limbs. You can place a hand or elbow on your thigh or rest a knee on the surface you are reaching across. This will provide you more stability so that your muscles do not fatigue as quickly. By keeping your knees relaxed, or slightly bent, you will also reduce stress across your low back. CORRECT LIFTING TECHNIQUES DO :   Assume a wide stance. This will provide you more stability and the opportunity to get as close as possible to the object which you are lifting.  Tense your abdominals to brace your spine; then bend at the knees and hips. Keeping your back locked in a neutral-spine position, lift using your leg muscles.  Lift with your legs, keeping your back straight.  Test the weight of unknown objects before attempting to lift them.  Try to keep your elbows locked down at your sides in order get the best strength from your shoulders when carrying an object.  Always ask for help when lifting heavy or awkward objects. INCORRECT LIFTING TECHNIQUES DO NOT:   Lock your knees when lifting, even if it is a small object.  Bend and twist. Pivot at your feet or move your feet when needing to change directions.  Assume that you cannot safely pick up a paperclip without proper posture. Document Released: 11/10/2005 Document Revised: 02/02/2012 Document Reviewed: 02/22/2009 [facility] Patient Information 2014 [place], [place].

## 2013-05-05 NOTE — Progress Notes (Signed)
  Subjective:    Patient ID: Erin Anderson, female    DOB: April 27, 1961, 52 y.o.   MRN: [id number] Chief Complaint  Patient presents with  . Shoulder Pain    follow-up  . Headache    HPI   She gets headaches in frontal sinuses - center of forehead Has an extreme amount of acid in stomach no matter if she takes the prilosec or not.  Is trying to use more natural products - like aloe-vera.  Is also very expensive. Had upper and lower endoscopy. Constipation issues now and so taking flax oil which helps a little. Fell onto shoulder in March and she can't even lay on left side anymore - she actually hurts all around anterior and posterior shoulder - now she can fully internally rotate but that is new.  No it can shoult down arm - hurts when she pushes on head of bicep.  Pain with full internal rotation  Past Medical History  Diagnosis Date  . Allergy   . Anxiety   . GERD (gastroesophageal reflux disease)   . Thyroid disease    Current Outpatient Prescriptions on File Prior to Visit  Medication Sig Dispense Refill  . cetirizine (ZYRTEC) 10 MG tablet Take 1 tablet (10 mg total) by mouth daily.  30 tablet  11  . fluticasone (FLONASE) 50 MCG/ACT nasal spray Place 2 sprays into the nose daily.  16 g  6  . levothyroxine (SYNTHROID, LEVOTHROID) 75 MCG tablet TAKE ONE TABLET BY MOUTH ONE TIME DAILY  90 tablet  2  . PRESCRIPTION MEDICATION Hormone Replacement therapy taking everyday      . sertraline (ZOLOFT) 100 MG tablet Take 1 tablet (100 mg total) by mouth daily.  90 tablet  1   No current facility-administered medications on file prior to visit.   Allergies  Allergen Reactions  . Sulfa Antibiotics   . Sulfonamide Derivatives   . Meloxicam Rash      Review of Systems    BP 142/86  Pulse 62  Temp(Src) 97.9 F (36.6 C) (Oral)  Resp 16  Ht 5\' 4"  (1.626 m)  Wt 138 lb (62.596 kg)  BMI 23.68 kg/m2  SpO2 99% Objective:   Physical Exam        Assessment & Plan:  Impingement  syndrome of left shoulder  Sciatic nerve pain, left  Migraine headache  Sinus headache  GERD (gastroesophageal reflux disease)  Unspecified hypothyroidism  Meds ordered this encounter  Medications  . diclofenac sodium (VOLTAREN) 1 % GEL    Sig: Apply 4 g topically 4 (four) times daily.    Dispense:  100 g    Refill:  5  . butalbital-acetaminophen-caffeine (FIORICET) 50-325-40 MG per tablet    Sig: Take 1 tablet by mouth every 6 (six) hours as needed for headache.    Dispense:  30 tablet    Refill:  0  . traMADol (ULTRAM) 50 MG tablet    Sig: Take 1 tablet (50 mg total) by mouth every 8 (eight) hours as needed for pain (shoulder pain, sciatic pain, or headache).    Dispense:  60 tablet    Refill:  1  . LORazepam (ATIVAN) 0.5 MG tablet    Sig: Take 1 tablet (0.5 mg total) by mouth every 8 (eight) hours.    Dispense:  30 tablet    Refill:  4

## 2013-08-12 ENCOUNTER — Other Ambulatory Visit: Payer: Self-pay | Admitting: Family Medicine

## 2013-08-13 ENCOUNTER — Other Ambulatory Visit: Payer: Self-pay | Admitting: Family Medicine

## 2013-08-15 ENCOUNTER — Other Ambulatory Visit: Payer: Self-pay | Admitting: Family Medicine

## 2013-09-15 ENCOUNTER — Other Ambulatory Visit: Payer: Self-pay | Admitting: Family Medicine

## 2013-10-19 ENCOUNTER — Other Ambulatory Visit: Payer: Self-pay | Admitting: Family Medicine

## 2013-10-22 ENCOUNTER — Other Ambulatory Visit: Payer: Self-pay | Admitting: Family Medicine

## 2013-11-08 ENCOUNTER — Other Ambulatory Visit: Payer: Self-pay | Admitting: Family Medicine

## 2013-11-09 NOTE — Telephone Encounter (Signed)
Advised pt needs OV/transferred to Scheduling.

## 2013-11-09 NOTE — Telephone Encounter (Signed)
Needs OV before any additional refills on a controlled substance.

## 2013-11-22 ENCOUNTER — Telehealth: Payer: Self-pay

## 2013-11-22 MED ORDER — TRAMADOL HCL 50 MG PO TABS: 50.0000 mg | ORAL_TABLET | Freq: Three times a day (TID) | ORAL | Status: DC | PRN

## 2013-11-22 NOTE — Telephone Encounter (Signed)
Pharm faxed req for tramadol 50 mg TID prn #[phone]. Pt has appt sch for 12/02/13.

## 2013-11-22 NOTE — Telephone Encounter (Signed)
Refilled

## 2013-11-24 NOTE — Telephone Encounter (Signed)
Faxed in

## 2013-12-02 ENCOUNTER — Encounter: Payer: Self-pay | Admitting: Family Medicine

## 2013-12-02 ENCOUNTER — Ambulatory Visit (INDEPENDENT_AMBULATORY_CARE_PROVIDER_SITE_OTHER): Payer: BC Managed Care – PPO | Admitting: Family Medicine

## 2013-12-02 VITALS — BP 136/82 | HR 66 | Temp 99.0°F | Resp 16 | Ht 64.25 in | Wt 146.2 lb

## 2013-12-02 DIAGNOSIS — Z207 Contact with and (suspected) exposure to pediculosis, acariasis and other infestations: Secondary | ICD-10-CM

## 2013-12-02 DIAGNOSIS — E890 Postprocedural hypothyroidism: Secondary | ICD-10-CM

## 2013-12-02 DIAGNOSIS — R51 Headache: Secondary | ICD-10-CM

## 2013-12-02 DIAGNOSIS — Z79899 Other long term (current) drug therapy: Secondary | ICD-10-CM

## 2013-12-02 DIAGNOSIS — G4700 Insomnia, unspecified: Secondary | ICD-10-CM

## 2013-12-02 DIAGNOSIS — Z2089 Contact with and (suspected) exposure to other communicable diseases: Secondary | ICD-10-CM

## 2013-12-02 DIAGNOSIS — J029 Acute pharyngitis, unspecified: Secondary | ICD-10-CM

## 2013-12-02 DIAGNOSIS — J069 Acute upper respiratory infection, unspecified: Secondary | ICD-10-CM

## 2013-12-02 LAB — BASIC METABOLIC PANEL
BUN: 14 mg/dL (ref 6–23)
CALCIUM: 9.3 mg/dL (ref 8.4–10.5)
CO2: 28 meq/L (ref 19–32)
CREATININE: 0.89 mg/dL (ref 0.50–1.10)
Chloride: 102 mEq/L (ref 96–112)
Glucose, Bld: 87 mg/dL (ref 70–99)
Potassium: 4.3 mEq/L (ref 3.5–5.3)
Sodium: 137 mEq/L (ref 135–145)

## 2013-12-02 LAB — POCT CBC
Granulocyte percent: 63.8 %G (ref 37–80)
HCT, POC: 42.9 % (ref 37.7–47.9)
Hemoglobin: 13.5 g/dL (ref 12.2–16.2)
LYMPH, POC: 2.1 (ref 0.6–3.4)
MCH, POC: 29.1 pg (ref 27–31.2)
MCHC: 31.5 g/dL — AB (ref 31.8–35.4)
MCV: 92.5 fL (ref 80–97)
MID (cbc): 0.5 (ref 0–0.9)
MPV: 10.1 fL (ref 0–99.8)
POC GRANULOCYTE: 4.5 (ref 2–6.9)
POC LYMPH %: 29.3 % (ref 10–50)
POC MID %: 6.9 % (ref 0–12)
Platelet Count, POC: 311 10*3/uL (ref 142–424)
RBC: 4.64 M/uL (ref 4.04–5.48)
RDW, POC: 16.1 %
WBC: 7.1 10*3/uL (ref 4.6–10.2)

## 2013-12-02 MED ORDER — TRAMADOL HCL 50 MG PO TABS: 50.0000 mg | ORAL_TABLET | Freq: Three times a day (TID) | ORAL | Status: DC | PRN

## 2013-12-02 MED ORDER — CETIRIZINE HCL 10 MG PO TABS: 10.0000 mg | ORAL_TABLET | Freq: Every day | ORAL | Status: DC

## 2013-12-02 MED ORDER — LORAZEPAM 0.5 MG PO TABS: ORAL_TABLET | ORAL | Status: DC

## 2013-12-02 MED ORDER — SERTRALINE HCL 100 MG PO TABS: ORAL_TABLET | ORAL | Status: DC

## 2013-12-02 MED ORDER — PERMETHRIN 1 % EX LOTN: TOPICAL_LOTION | CUTANEOUS | Status: DC

## 2013-12-02 MED ORDER — IPRATROPIUM BROMIDE 0.03 % NA SOLN: 2.0000 | Freq: Four times a day (QID) | NASAL | Status: DC

## 2013-12-02 MED ORDER — BUTALBITAL-APAP-CAFFEINE 50-325-40 MG PO TABS: ORAL_TABLET | ORAL | Status: DC

## 2013-12-02 MED ORDER — FLUTICASONE PROPIONATE 50 MCG/ACT NA SUSP: NASAL | Status: DC

## 2013-12-02 MED ORDER — PSEUDOEPHEDRINE HCL ER 120 MG PO TB12: 120.0000 mg | ORAL_TABLET | Freq: Two times a day (BID) | ORAL | Status: DC

## 2013-12-02 NOTE — Progress Notes (Signed)
Subjective:    Patient ID: Erin Anderson, female    DOB: March 09, 1961, 53 y.o.   MRN: [id number] Chief Complaint  Patient presents with  . Medication Refill  . Sore Throat  . 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 Just found out about the lice today so now pt wondering if she could have it and if she should treat herself and her son or not.  Boyfriend w/ severe sinus infection. Pt was feeling fine until today - itching ears and scratchy throat.  Past Medical History  Diagnosis Date  . Allergy   . Anxiety   . GERD (gastroesophageal reflux disease)   . Thyroid disease    Current Outpatient Prescriptions on File Prior to Visit  Medication Sig Dispense Refill  . diclofenac sodium (VOLTAREN) 1 % GEL Apply 4 g topically 4 (four) times daily.  100 g  5  . PRESCRIPTION MEDICATION Hormone Replacement therapy taking everyday       No current facility-administered medications on file prior to visit.   Allergies  Allergen Reactions  . Sulfa Antibiotics   . Sulfonamide Derivatives   . Meloxicam Rash     Review of Systems  Constitutional: Negative for fever, chills, diaphoresis, activity change, appetite change and fatigue.  HENT: Positive for congestion, ear pain, sneezing and sore throat. Negative for ear discharge, nosebleeds, postnasal drip, rhinorrhea, sinus pressure, trouble swallowing and voice change.   Eyes: Negative for discharge and itching.  Respiratory: Negative for cough and shortness of breath.   Cardiovascular: Negative for chest pain.  Gastrointestinal: Negative for nausea, vomiting and abdominal pain.  Musculoskeletal: Positive for arthralgias. Negative for joint swelling, myalgias, neck pain and neck stiffness.  Skin: Negative for rash.  Neurological: Positive for headaches. Negative for dizziness  and syncope.  Hematological: Negative for adenopathy.  Psychiatric/Behavioral: Positive for sleep disturbance.      BP 136/82  Pulse 66  Temp(Src) 99 F (37.2 C) (Oral)  Resp 16  Ht 5' 4.25" (1.632 m)  Wt 146 lb 3.2 oz (66.316 kg)  BMI 24.90 kg/m2  SpO2 98% Objective:   Physical Exam  Constitutional: She is oriented to person, place, and time. She appears well-developed and well-nourished. No distress.  HENT:  Head: Normocephalic and atraumatic.  Right Ear: External ear normal.  Left Ear: External ear normal.  Scalp nml.  Eyes: Conjunctivae are normal. No scleral icterus.  Neck: Normal range of motion. Neck supple. No thyromegaly present.  Cardiovascular: Normal rate, regular rhythm, normal heart sounds and intact distal pulses.   Pulmonary/Chest: Effort normal and breath sounds normal. No respiratory distress.  Musculoskeletal: She exhibits no edema.  Lymphadenopathy:    She has no cervical adenopathy.  Neurological: She is alert and oriented to person, place, and time.  Skin: Skin is warm and dry. She is not diaphoretic. No erythema.  Psychiatric: She has a normal mood and affect. Her behavior is normal.          Assessment & Plan:  HYPOTHYROIDISM, POST-RADIATION - Plan: TSH - will need levothyroxine refilled after labs come back.  - TSH nml, levothyroxine refilled x 1 yr as has been stable for > 1 yr.  INSOMNIA  Headache(784.0) - Plan: cetirizine (ZYRTEC) 10 MG tablet  Encounter for long-term (  current) use of other medications - Plan: Basic metabolic panel  Acute pharyngitis - Plan: POCT CBC  Meds ordered this encounter  Medications  . sertraline (ZOLOFT) 100 MG tablet    Sig: TAKE ONE TABLET BY MOUTH ONE TIME DAILY    Dispense:  90 tablet    Refill:  2  . LORazepam (ATIVAN) 0.5 MG tablet    Sig: TAKE ONE TABLET BY MOUTH EVERY EIGHT HOURS    Dispense:  30 tablet    Refill:  5    Needs OV before any additional refills on a controlled substance.  .  traMADol (ULTRAM) 50 MG tablet    Sig: Take 1 tablet (50 mg total) by mouth every 8 (eight) hours as needed.    Dispense:  60 tablet    Refill:  1  . butalbital-acetaminophen-caffeine (FIORICET, ESGIC) 50-325-40 MG per tablet    Sig: TAKE ONE TABLET BY MOUTH EVERY SIX HOURS AS NEEDED FOR HEADACHE    Dispense:  30 tablet    Refill:  0  . fluticasone (FLONASE) 50 MCG/ACT nasal spray    Sig: Use two sprays in each nostril daily    Dispense:  16 g    Refill:  11  . cetirizine (ZYRTEC) 10 MG tablet    Sig: Take 1 tablet (10 mg total) by mouth daily.    Dispense:  30 tablet    Refill:  11  . permethrin (PERMETHRIN LICE TREATMENT) 1 % lotion    Sig: Shampoo, rinse and towel dry hair, then saturate hair and scalp with permethrin. Rinse after 10 min; repeat in 1 week prn    Dispense:  120 mL    Refill:  1  . pseudoephedrine (SUDAFED 12 HOUR) 120 MG 12 hr tablet    Sig: Take 1 tablet (120 mg total) by mouth 2 (two) times daily.    Dispense:  30 tablet    Refill:  0  . ipratropium (ATROVENT) 0.03 % nasal spray    Sig: Place 2 sprays into the nose 4 (four) times daily.    Dispense:  30 mL    Refill:  1   Norberto Sorenson, MD MPH

## 2013-12-03 LAB — TSH: TSH: 1.856 u[IU]/mL (ref 0.350–4.500)

## 2013-12-06 ENCOUNTER — Encounter: Payer: Self-pay | Admitting: Family Medicine

## 2013-12-06 MED ORDER — LEVOTHYROXINE SODIUM 75 MCG PO TABS: ORAL_TABLET | ORAL | Status: DC

## 2014-03-02 ENCOUNTER — Ambulatory Visit (INDEPENDENT_AMBULATORY_CARE_PROVIDER_SITE_OTHER): Payer: BC Managed Care – PPO | Admitting: Family Medicine

## 2014-03-02 VITALS — BP 150/90 | HR 72 | Temp 98.1°F | Resp 16 | Ht 64.0 in | Wt 144.0 lb

## 2014-03-02 DIAGNOSIS — IMO0001 Reserved for inherently not codable concepts without codable children: Secondary | ICD-10-CM

## 2014-03-02 DIAGNOSIS — R030 Elevated blood-pressure reading, without diagnosis of hypertension: Secondary | ICD-10-CM

## 2014-03-02 DIAGNOSIS — R42 Dizziness and giddiness: Secondary | ICD-10-CM

## 2014-03-02 DIAGNOSIS — E039 Hypothyroidism, unspecified: Secondary | ICD-10-CM

## 2014-03-02 LAB — POCT CBC
Granulocyte percent: 64.2 %G (ref 37–80)
HCT, POC: 41.7 % (ref 37.7–47.9)
HEMOGLOBIN: 13.4 g/dL (ref 12.2–16.2)
LYMPH, POC: 1.7 (ref 0.6–3.4)
MCH, POC: 30.5 pg (ref 27–31.2)
MCHC: 32.1 g/dL (ref 31.8–35.4)
MCV: 94.8 fL (ref 80–97)
MID (cbc): 0.4 (ref 0–0.9)
MPV: 9.3 fL (ref 0–99.8)
POC Granulocyte: 3.7 (ref 2–6.9)
POC LYMPH PERCENT: 28.5 %L (ref 10–50)
POC MID %: 7.3 %M (ref 0–12)
Platelet Count, POC: 304 10*3/uL (ref 142–424)
RBC: 4.4 M/uL (ref 4.04–5.48)
RDW, POC: 15.7 %
WBC: 5.8 10*3/uL (ref 4.6–10.2)

## 2014-03-02 LAB — GLUCOSE, POCT (MANUAL RESULT ENTRY): POC Glucose: 80 mg/dl (ref 70–99)

## 2014-03-02 NOTE — Progress Notes (Addendum)
[facility] [address], [place] [id number] [phone]  Date:  03/02/2014   Name:  Erin Harmsngela W Kelliher   DOB:  February 08, 1961   MRN:  [id number]  PCP:  Norberto SorensonSHAW,EVA, MD    Chief Complaint: Dizziness and Hypertension   History of Present Illness:  Erin Anderson is a 53 y.o. very pleasant female patient who presents with the following: dizziness and lightheadedness that started this morning when pt woke up.  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  While at work she continued to feel strange, so a coworker took her blood pressure; 152/100.  No associated headache, nausea, vomiting.  Still feeling lightheaded  Last had thyroid checked in January- normal.  No changes in medication.  Pt has several life stressors including separation from husband.  No increased stress over the week from her recent baseline however.  Taking 2 benadryl tablets for the prior 14 days at night to help with allergies and sleep x 5 days. She did drink about one beer last night.    Denies changes in sensation with head movement.  However on closer questioning she does relate some worsening of her sx with movement- however not classic BPPV  Has had a similar situation 3 yrs ago where she was lightheaded and bp was elevated.  Does not remember more details about this time but her sx resolved.   Looking back in her chart her BP has been elevated on several occasions, however it is also sometimes normal.  11/2013:136/82 04/2013: 142/86 01/2013: 150/10 01/2013: 162/92 Patient Active Problem List   Diagnosis Date Noted  . Headache 01/30/2013  . HYPERTENSION 12/06/2008  . HYPOTHYROIDISM, POST-RADIATION 02/16/2008  . ANXIETY 08/25/2007  . GOITER, MULTINODULAR 08/24/2007  . INSOMNIA 08/24/2007    Past Medical History  Diagnosis Date  . Allergy   . Anxiety   . GERD (gastroesophageal reflux disease)   . Thyroid disease     Past Surgical History  Procedure Laterality Date  . Cesarean section       History  Substance Use Topics  . Smoking status: Former Smoker -- 1.00 packs/day for 10 years    Types: Cigarettes    Quit date: 12/31/1985  . Smokeless tobacco: Never Used  . Alcohol Use: Yes     Comment: 4-5 weekly    Family History  Problem Relation Age of Onset  . Alcohol abuse Father     died at age 54 from alcohol related issues    Allergies  Allergen Reactions  . Sulfa Antibiotics   . Sulfonamide Derivatives   . Meloxicam Rash    Medication list has been reviewed and updated.  Current Outpatient Prescriptions on File Prior to Visit  Medication Sig Dispense Refill  . butalbital-acetaminophen-caffeine (FIORICET, ESGIC) 50-325-40 MG per tablet TAKE ONE TABLET BY MOUTH EVERY SIX HOURS AS NEEDED FOR HEADACHE  30 tablet  0  . cetirizine (ZYRTEC) 10 MG tablet Take 1 tablet (10 mg total) by mouth daily.  30 tablet  11  . diclofenac sodium (VOLTAREN) 1 % GEL Apply 4 g topically 4 (four) times daily.  100 g  5  . fluticasone (FLONASE) 50 MCG/ACT nasal spray Use two sprays in each nostril daily  16 g  11  . levothyroxine (SYNTHROID, LEVOTHROID) 75 MCG tablet TAKE ONE TABLET BY MOUTH ONE TIME DAILY  90 tablet  3  . LORazepam (ATIVAN) 0.5 MG tablet TAKE ONE TABLET BY MOUTH EVERY EIGHT HOURS  30  tablet  5  . PRESCRIPTION MEDICATION Hormone Replacement therapy taking everyday      . sertraline (ZOLOFT) 100 MG tablet TAKE ONE TABLET BY MOUTH ONE TIME DAILY  90 tablet  2  . traMADol (ULTRAM) 50 MG tablet Take 1 tablet (50 mg total) by mouth every 8 (eight) hours as needed.  60 tablet  1   No current facility-administered medications on file prior to visit.    Review of Systems:  General; denies fevers, night sweats, admits 6 lb wt gain in the past month Cardio - denies chest pain and palpitations resp - denies sob, cough Gi - denies ab pain, diarrhea GU - denies vaginal d/c, painful urination Endo - denies diff with external temp, denies dry skin, increased  thrist  Physical Examination: Filed Vitals:   03/02/14 1139  BP: 134/106  Pulse: 72  Temp: 98.1 F (36.7 C)  Resp: 16   Filed Vitals:   03/02/14 1139  Height: 5\' 4"  (1.626 m)  Weight: 144 lb (65.318 kg)   Body mass index is 24.71 kg/(m^2). Ideal Body Weight: Weight in (lb) to have BMI = 25: 145.3  GEN: WDWN, NAD, Non-toxic, A & O x 3, looks well HEENT: Atraumatic, Normocephalic. Neck supple. No masses, No LAD.  Bilateral TM wnl, oropharynx normal.  PEERL,EOMI.   No meningismus Ears and Nose: No external deformity. CV: RRR, No M/G/R. No JVD. No thrill. No extra heart sounds. PULM: CTA B, no wheezes, crackles, rhonchi. No retractions. No resp. distress. No accessory muscle use. EXTR: No c/c/e NEURO Normal gait. II-XII CN intact.  5/5 strength upper and lw ext.  Normal sensation. Cerebral testing intact. DTR normal in all extremities.   PSYCH: Normally interactive. Conversant. Not depressed or anxious appearing.  Calm demeanor.   BP recheck R: 140/102  L: 140/104  EKG: reviewed as normal sinus rhythm   Results for orders placed in visit on 03/02/14  POCT CBC      Result Value Ref Range   WBC 5.8  4.6 - 10.2 K/uL   Lymph, poc 1.7  0.6 - 3.4   POC LYMPH PERCENT 28.5  10 - 50 %L   MID (cbc) 0.4  0 - 0.9   POC MID % 7.3  0 - 12 %M   POC Granulocyte 3.7  2 - 6.9   Granulocyte percent 64.2  37 - 80 %G   RBC 4.40  4.04 - 5.48 M/uL   Hemoglobin 13.4  12.2 - 16.2 g/dL   HCT, POC 16.1  09.6 - 47.9 %   MCV 94.8  80 - 97 fL   MCH, POC 30.5  27 - 31.2 pg   MCHC 32.1  31.8 - 35.4 g/dL   RDW, POC 04.5     Platelet Count, POC 304  142 - 424 K/uL   MPV 9.3  0 - 99.8 fL  GLUCOSE, POCT (MANUAL RESULT ENTRY)      Result Value Ref Range   POC Glucose 80  70 - 99 mg/dl    Assessment and Plan:Elevated blood pressure - Plan: EKG 12-Lead, Comprehensive metabolic panel  Unspecified hypothyroidism - Plan: TSH  Lightheaded - Plan: POCT CBC, POCT glucose (manual entry)  Discussed in  detail with Marylene Land.  We do not have a definite dx for her sx.  However her normal exam is reassuring.  She certainly does not have any neurological deficits.  Offered a CT of her head today, but she declines.  She is easily able to  follow-up her BP at work and will obtain a series of readings over the next week.  If continues to be elevated will start medication.   Await TSH to rule- out thyrotoxicity. Encouraged rest, frequent small meals, fluids.  Noted borderline low glucose.    Called around 9pm to check in with her.  She is about the same, no worse.  She will call if any change in her condition.  OW plan to follow-up tomorrow with the rest of her labs.   She is on zoloft so need to think about serotonin syndrome; however at this time she does not have suggestive sx or signs such as tachycardia, agitation, stiffness, clonus, mentation changes or hyperreflexia.  Went over sx of this illness with her so she can be aware; she will report any change.  She has not been taking tramadol and will continue to avoid it.    Signed Abbe Amsterdam, MD  Called 4/10Marita [place] with labs, these are normal.  Asked her to please let me know if sx persist.  Will send lab letter

## 2014-03-02 NOTE — Patient Instructions (Addendum)
Please check your blood pressure at work over the next week.  Call me with the readings and we can decide if medication is indicated at that time.    We will call you with the results of the thyroid fcn test.    Please continue to eat regular meals, drink adequate fluids, and rest.

## 2014-03-03 ENCOUNTER — Encounter: Payer: Self-pay | Admitting: Family Medicine

## 2014-03-03 LAB — TSH: TSH: 2.247 u[IU]/mL (ref 0.350–4.500)

## 2014-03-03 LAB — COMPREHENSIVE METABOLIC PANEL
ALT: 16 U/L (ref 0–35)
AST: 25 U/L (ref 0–37)
Albumin: 4.6 g/dL (ref 3.5–5.2)
Alkaline Phosphatase: 61 U/L (ref 39–117)
BUN: 11 mg/dL (ref 6–23)
CALCIUM: 9.7 mg/dL (ref 8.4–10.5)
CHLORIDE: 101 meq/L (ref 96–112)
CO2: 28 meq/L (ref 19–32)
Creat: 0.62 mg/dL (ref 0.50–1.10)
Glucose, Bld: 82 mg/dL (ref 70–99)
POTASSIUM: 4.2 meq/L (ref 3.5–5.3)
SODIUM: 138 meq/L (ref 135–145)
TOTAL PROTEIN: 7.2 g/dL (ref 6.0–8.3)
Total Bilirubin: 0.5 mg/dL (ref 0.2–1.2)

## 2014-03-23 ENCOUNTER — Other Ambulatory Visit: Payer: Self-pay | Admitting: Family Medicine

## 2014-03-23 NOTE — Telephone Encounter (Signed)
Dr Clelia CroftShaw, do you want to RF or have pt RTC?

## 2014-03-27 NOTE — Telephone Encounter (Signed)
Faxed RX 

## 2014-06-04 ENCOUNTER — Other Ambulatory Visit: Payer: Self-pay | Admitting: Family Medicine

## 2014-06-06 NOTE — Telephone Encounter (Signed)
Pt has only filled the ativan 3 times in the past 6 months so did not use 3 of her refills so will call in rx with 2 refills.

## 2014-06-06 NOTE — Telephone Encounter (Signed)
Called in script to the pharmacy.

## 2014-07-04 ENCOUNTER — Other Ambulatory Visit: Payer: Self-pay | Admitting: Family Medicine

## 2014-07-07 NOTE — Telephone Encounter (Signed)
Faxed

## 2014-09-14 LAB — HM MAMMOGRAPHY

## 2014-09-21 ENCOUNTER — Other Ambulatory Visit: Payer: Self-pay | Admitting: Family Medicine

## 2014-09-26 ENCOUNTER — Other Ambulatory Visit: Payer: Self-pay | Admitting: Family Medicine

## 2014-11-22 ENCOUNTER — Ambulatory Visit (INDEPENDENT_AMBULATORY_CARE_PROVIDER_SITE_OTHER): Payer: BC Managed Care – PPO

## 2014-11-22 ENCOUNTER — Other Ambulatory Visit: Payer: Self-pay | Admitting: Family Medicine

## 2014-11-22 ENCOUNTER — Ambulatory Visit (INDEPENDENT_AMBULATORY_CARE_PROVIDER_SITE_OTHER): Payer: BC Managed Care – PPO | Admitting: Family Medicine

## 2014-11-22 VITALS — BP 126/78 | HR 75 | Temp 98.5°F | Resp 18 | Ht 65.0 in | Wt 149.0 lb

## 2014-11-22 DIAGNOSIS — M542 Cervicalgia: Secondary | ICD-10-CM

## 2014-11-22 DIAGNOSIS — S161XXA Strain of muscle, fascia and tendon at neck level, initial encounter: Secondary | ICD-10-CM

## 2014-11-22 MED ORDER — CYCLOBENZAPRINE HCL 5 MG PO TABS: 5.0000 mg | ORAL_TABLET | Freq: Three times a day (TID) | ORAL | Status: DC | PRN

## 2014-11-22 NOTE — Patient Instructions (Signed)
You may take flexeril three times a day for muscle spasm. Continue with ice. May want to alternate with heat. May use voltaren gel over affected area. Return to clinic in 1 week
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
## 2014-12-10 NOTE — Progress Notes (Signed)
Reviewed documentation and xray and agree w/ assessment and plan. Eva Shaw, MD MPH   

## 2014-12-11 ENCOUNTER — Other Ambulatory Visit: Payer: Self-pay | Admitting: Family Medicine

## 2014-12-28 ENCOUNTER — Other Ambulatory Visit: Payer: Self-pay | Admitting: Family Medicine

## 2015-01-10 ENCOUNTER — Other Ambulatory Visit: Payer: Self-pay | Admitting: Physician Assistant

## 2015-01-11 ENCOUNTER — Other Ambulatory Visit: Payer: Self-pay

## 2015-01-11 MED ORDER — CETIRIZINE HCL 10 MG PO TABS: ORAL_TABLET | ORAL | Status: DC

## 2015-01-26 ENCOUNTER — Other Ambulatory Visit: Payer: Self-pay | Admitting: Physician Assistant

## 2015-01-29 NOTE — Telephone Encounter (Signed)
Needs OV for refills on ALL Medications - has not had thyroid checked in 1 yr. Please have pt sched an appt at 104 - is going to be due to refills of most her meds - inc her thyroid - in <1 mo - hasn't had thyroid checked in a yr.

## 2015-01-30 ENCOUNTER — Other Ambulatory Visit: Payer: Self-pay | Admitting: Physician Assistant

## 2015-01-30 NOTE — Telephone Encounter (Signed)
Pt cb and agreed to sch appt. Transferred to Scheduling.

## 2015-01-30 NOTE — Telephone Encounter (Signed)
LMOM to CB. Sent in Fioricet and sertraline RFs, but pt needs to sch OV for all RFs.

## 2015-01-31 ENCOUNTER — Other Ambulatory Visit: Payer: Self-pay | Admitting: Physician Assistant

## 2015-02-02 ENCOUNTER — Other Ambulatory Visit: Payer: Self-pay

## 2015-02-02 MED ORDER — LEVOTHYROXINE SODIUM 75 MCG PO TABS: 75.0000 ug | ORAL_TABLET | Freq: Every day | ORAL | Status: DC

## 2015-02-02 NOTE — Telephone Encounter (Signed)
Appointment made with Dr Clelia CroftShaw on 03/02/15 @ 3pm.

## 2015-02-02 NOTE — Telephone Encounter (Signed)
Pt called and reported she made appt for 03/02/15 w/Dr Clelia CroftShaw, but needs another mos of thryoid med until then. Sent in 1 more mos.

## 2015-02-13 ENCOUNTER — Other Ambulatory Visit: Payer: Self-pay | Admitting: Physician Assistant

## 2015-02-21 ENCOUNTER — Other Ambulatory Visit: Payer: Self-pay

## 2015-02-21 MED ORDER — CETIRIZINE HCL 10 MG PO TABS: ORAL_TABLET | ORAL | Status: DC

## 2015-02-21 MED ORDER — SERTRALINE HCL 100 MG PO TABS: 100.0000 mg | ORAL_TABLET | Freq: Every day | ORAL | Status: DC

## 2015-02-21 NOTE — Telephone Encounter (Signed)
Pharm reqs for sertraline and cetirizine. Pt has appt sch now for 03/02/15. I will give 1 more mos RF of each.

## 2015-03-02 ENCOUNTER — Ambulatory Visit (INDEPENDENT_AMBULATORY_CARE_PROVIDER_SITE_OTHER): Payer: BLUE CROSS/BLUE SHIELD | Admitting: Family Medicine

## 2015-03-02 ENCOUNTER — Encounter: Payer: Self-pay | Admitting: Family Medicine

## 2015-03-02 VITALS — BP 159/97 | HR 70 | Temp 98.3°F | Resp 16 | Ht 65.5 in | Wt 143.0 lb

## 2015-03-02 DIAGNOSIS — Z79899 Other long term (current) drug therapy: Secondary | ICD-10-CM | POA: Diagnosis not present

## 2015-03-02 DIAGNOSIS — R519 Headache, unspecified: Secondary | ICD-10-CM

## 2015-03-02 DIAGNOSIS — R51 Headache: Secondary | ICD-10-CM

## 2015-03-02 DIAGNOSIS — E039 Hypothyroidism, unspecified: Secondary | ICD-10-CM

## 2015-03-02 LAB — CBC
HEMATOCRIT: 43.9 % (ref 36.0–46.0)
Hemoglobin: 15.1 g/dL — ABNORMAL HIGH (ref 12.0–15.0)
MCH: 31.1 pg (ref 26.0–34.0)
MCHC: 34.4 g/dL (ref 30.0–36.0)
MCV: 90.5 fL (ref 78.0–100.0)
MPV: 10.4 fL (ref 8.6–12.4)
Platelets: 365 10*3/uL (ref 150–400)
RBC: 4.85 MIL/uL (ref 3.87–5.11)
RDW: 13.6 % (ref 11.5–15.5)
WBC: 7.1 10*3/uL (ref 4.0–10.5)

## 2015-03-02 LAB — COMPREHENSIVE METABOLIC PANEL
ALBUMIN: 4.5 g/dL (ref 3.5–5.2)
ALK PHOS: 57 U/L (ref 39–117)
ALT: 23 U/L (ref 0–35)
AST: 26 U/L (ref 0–37)
BUN: 10 mg/dL (ref 6–23)
CHLORIDE: 101 meq/L (ref 96–112)
CO2: 28 mEq/L (ref 19–32)
Calcium: 9.6 mg/dL (ref 8.4–10.5)
Creat: 0.7 mg/dL (ref 0.50–1.10)
Glucose, Bld: 83 mg/dL (ref 70–99)
POTASSIUM: 4.3 meq/L (ref 3.5–5.3)
SODIUM: 138 meq/L (ref 135–145)
Total Bilirubin: 0.5 mg/dL (ref 0.2–1.2)
Total Protein: 7.1 g/dL (ref 6.0–8.3)

## 2015-03-02 LAB — TSH: TSH: 4.182 u[IU]/mL (ref 0.350–4.500)

## 2015-03-02 MED ORDER — SERTRALINE HCL 100 MG PO TABS: 100.0000 mg | ORAL_TABLET | Freq: Every day | ORAL | Status: DC

## 2015-03-02 MED ORDER — CETIRIZINE HCL 10 MG PO TABS: ORAL_TABLET | ORAL | Status: DC

## 2015-03-02 MED ORDER — LEVOTHYROXINE SODIUM 75 MCG PO TABS: 75.0000 ug | ORAL_TABLET | Freq: Every day | ORAL | Status: DC

## 2015-03-02 MED ORDER — BUTALBITAL-APAP-CAFFEINE 50-325-40 MG PO TABS: 1.0000 | ORAL_TABLET | Freq: Four times a day (QID) | ORAL | Status: DC | PRN

## 2015-03-02 MED ORDER — FLUTICASONE PROPIONATE 50 MCG/ACT NA SUSP
2.0000 | Freq: Every day | NASAL | Status: DC
Start: 2015-03-02 — End: 2016-03-06

## 2015-03-02 MED ORDER — TRAMADOL HCL 50 MG PO TABS: 50.0000 mg | ORAL_TABLET | Freq: Three times a day (TID) | ORAL | Status: DC | PRN

## 2015-03-02 MED ORDER — DICLOFENAC SODIUM 1 % TD GEL
4.0000 g | Freq: Four times a day (QID) | TRANSDERMAL | Status: DC
Start: 2015-03-02 — End: 2016-03-27

## 2015-03-02 MED ORDER — LORAZEPAM 0.5 MG PO TABS: ORAL_TABLET | ORAL | Status: DC

## 2015-03-02 NOTE — Progress Notes (Addendum)
Subjective:    Patient ID: Erin Anderson, female    DOB: [birth year], 54 y.o.   MRN: [id number] This chart was scribed for Norberto Sorenson, MD by Littie Deeds, Medical Scribe. This patient was seen in Room 21 and the patient's care was started at 3:16 PM.   Chief Complaint  Patient presents with  . Medication Refill    HPI HPI Comments: Erin Anderson is a 54 y.o. female with a hx of hypertension, hypothyroidism (post-radiation), migraine, and anxiety who presents to the [facility] for medication refills.  Last saw patient January, 2015. She has not had her thyroid and other labs checked in the past year. She had normal pap smear with negative high risk HPV in 2014, so next pap is not due until 2019. Colonoscopy was done in 2013. Tetanus in 2009.   Migraine: Patient still gets occasional migraine headaches, although they are not usually severe. Her last severe headache was last summer, about a year ago. Her headaches typically start on the left side of the head and usually start after waking up in the morning. She thinks her migraine headaches may be related to allergies; she takes Zyrtec and Flonase year-round. Patient notes that the Fioricet does provide relief to her headaches if she takes it early enough.  Anxiety: Her mood has been pretty good for the most part, but she does get stressed sometimes. She states she has been sleeping fine for the most part. Patient does not take Ativan weekly and will sometimes go several weeks without taking it, but she will take it sometimes - usually at night, but sometimes during the day.  Arthralgias: Patient reports having pain to her knees, back and shoulders every once in a while. She has Voltaren for this, but she has not used it in a while. She would still like to keep a prescription of Voltaren.  Health Maintenance: She did have a mammogram done recently at [facility]. She does not take any OTC vitamin D. Patient has been doing  some yoga at home and walking for exercise. She plans on joining some yoga classes.  Hypothyroidism: Patient denies any thyroid symptoms.  Patient is not fasting.  Past Medical History  Diagnosis Date  . Allergy   . Anxiety   . GERD (gastroesophageal reflux disease)   . Thyroid disease    Current Outpatient Prescriptions on File Prior to Visit  Medication Sig Dispense Refill  . PRESCRIPTION MEDICATION Hormone Replacement therapy taking everyday     No current facility-administered medications on file prior to visit.   Allergies  Allergen Reactions  . Sulfa Antibiotics   . Sulfonamide Derivatives   . Meloxicam Rash     Review of Systems  HENT: Positive for congestion and rhinorrhea.   Musculoskeletal: Positive for back pain and arthralgias. Negative for joint swelling and gait problem.  Neurological: Positive for headaches.  Psychiatric/Behavioral: Negative for sleep disturbance. The patient is nervous/anxious.   All other systems reviewed and are negative.      Objective:  BP 159/97 mmHg  Pulse 70  Temp(Src) 98.3 F (36.8 C) (Oral)  Resp 16  Ht 5' 5.5" (1.664 m)  Wt 143 lb (64.864 kg)  BMI 23.43 kg/m2  SpO2 99%  Physical Exam  Constitutional: She is oriented to person, place, and time. She appears well-developed and well-nourished. No distress.  HENT:  Head: Normocephalic and atraumatic.  Mouth/Throat: Oropharynx is clear and moist. No oropharyngeal exudate.  Eyes: Pupils are  equal, round, and reactive to light.  Neck: Neck supple. No thyromegaly present.  Thyroid normal.  Cardiovascular: Normal rate.   Murmur heard.  Systolic murmur is present with a grade of 2/6  2/6 systolic ejection murmur, left upper sternal border, 2+. Cleared when laying down.  Pulmonary/Chest: Effort normal and breath sounds normal. No respiratory distress. She has no wheezes. She has no rales.  CTAB.  Musculoskeletal: She exhibits no edema.  Neurological: She is alert and oriented  to person, place, and time. No cranial nerve deficit.  Skin: Skin is warm and dry. No rash noted.  Psychiatric: She has a normal mood and affect. Her behavior is normal.  Vitals reviewed.         Assessment & Plan:   1. Sinus headache   2. Hypothyroidism, unspecified hypothyroidism type   3. Polypharmacy     Orders Placed This Encounter  Procedures  . TSH  . CBC  . Comprehensive metabolic panel    Meds ordered this encounter  Medications  . butalbital-acetaminophen-caffeine (FIORICET, ESGIC) 50-325-40 MG per tablet    Sig: Take 1-2 tablets by mouth every 6 (six) hours as needed for headache.    Dispense:  60 tablet    Refill:  1  . cetirizine (ZYRTEC) 10 MG tablet    Sig: TAKE ONE TABLET BY MOUTH ONE TIME DAILY    Dispense:  90 tablet    Refill:  3  . diclofenac sodium (VOLTAREN) 1 % GEL    Sig: Apply 4 g topically 4 (four) times daily.    Dispense:  100 g    Refill:  5  . fluticasone (FLONASE) 50 MCG/ACT nasal spray    Sig: Place 2 sprays into both nostrils at bedtime.    Dispense:  16 g    Refill:  11  . levothyroxine (SYNTHROID, LEVOTHROID) 75 MCG tablet    Sig: Take 1 tablet (75 mcg total) by mouth daily before breakfast.    Dispense:  90 tablet    Refill:  3  . sertraline (ZOLOFT) 100 MG tablet    Sig: Take 1 tablet (100 mg total) by mouth daily.    Dispense:  90 tablet    Refill:  3  . traMADol (ULTRAM) 50 MG tablet    Sig: Take 1 tablet (50 mg total) by mouth every 8 (eight) hours as needed.    Dispense:  60 tablet    Refill:  2  . LORazepam (ATIVAN) 0.5 MG tablet    Sig: TAKE ONE TABLET BY MOUTH EVERY EIGHT HOURS    Dispense:  30 tablet    Refill:  2    I personally performed the services described in this documentation, which was scribed in my presence. The recorded information has been reviewed and considered, and addended by me as needed.  Norberto SorensonEva Shaw, MD MPH   Results for orders placed or performed in visit on 03/02/15  TSH  Result Value Ref  Range   TSH 4.182 0.350 - 4.500 uIU/mL  CBC  Result Value Ref Range   WBC 7.1 4.0 - 10.5 K/uL   RBC 4.85 3.87 - 5.11 MIL/uL   Hemoglobin 15.1 (H) 12.0 - 15.0 g/dL   HCT 16.143.9 09.636.0 - 04.546.0 %   MCV 90.5 78.0 - 100.0 fL   MCH 31.1 26.0 - 34.0 pg   MCHC 34.4 30.0 - 36.0 g/dL   RDW 40.913.6 81.111.5 - 91.415.5 %   Platelets 365 150 - 400 K/uL   MPV 10.4  8.6 - 12.4 fL  Comprehensive metabolic panel  Result Value Ref Range   Sodium 138 135 - 145 mEq/L   Potassium 4.3 3.5 - 5.3 mEq/L   Chloride 101 96 - 112 mEq/L   CO2 28 19 - 32 mEq/L   Glucose, Bld 83 70 - 99 mg/dL   BUN 10 6 - 23 mg/dL   Creat 1.61 0.96 - 0.45 mg/dL   Total Bilirubin 0.5 0.2 - 1.2 mg/dL   Alkaline Phosphatase 57 39 - 117 U/L   AST 26 0 - 37 U/L   ALT 23 0 - 35 U/L   Total Protein 7.1 6.0 - 8.3 g/dL   Albumin 4.5 3.5 - 5.2 g/dL   Calcium 9.6 8.4 - 40.9 mg/dL

## 2015-03-18 ENCOUNTER — Encounter: Payer: Self-pay | Admitting: Family Medicine

## 2015-04-25 ENCOUNTER — Telehealth: Payer: Self-pay | Admitting: *Deleted

## 2015-04-25 NOTE — Telephone Encounter (Signed)
Phoned & [facility] and she is faxing me the mammo results from 05/19/2014.  Will update health maintenance once received.

## 2015-04-25 NOTE — Telephone Encounter (Signed)
Received fax from [facility] for mammo dated 09/14/2014 - no mammographic evidence of malignancy.  Health maintenance updated and care team updated & mammo abstracted.

## 2015-07-20 DIAGNOSIS — R519 Headache, unspecified: Secondary | ICD-10-CM

## 2015-07-20 DIAGNOSIS — R51 Headache: Principal | ICD-10-CM

## 2015-09-02 ENCOUNTER — Other Ambulatory Visit: Payer: Self-pay | Admitting: Family Medicine

## 2015-09-04 NOTE — Telephone Encounter (Signed)
Faxed

## 2015-10-03 ENCOUNTER — Other Ambulatory Visit: Payer: Self-pay | Admitting: Obstetrics and Gynecology

## 2015-10-03 DIAGNOSIS — R928 Other abnormal and inconclusive findings on diagnostic imaging of breast: Secondary | ICD-10-CM

## 2015-10-10 ENCOUNTER — Ambulatory Visit
Admission: RE | Admit: 2015-10-10 | Discharge: 2015-10-10 | Disposition: A | Discharge status: Home / Self Care | Payer: BLUE CROSS/BLUE SHIELD | Source: Ambulatory Visit | Attending: Obstetrics and Gynecology | Admitting: Obstetrics and Gynecology

## 2015-10-10 DIAGNOSIS — R928 Other abnormal and inconclusive findings on diagnostic imaging of breast: Secondary | ICD-10-CM

## 2015-12-16 ENCOUNTER — Other Ambulatory Visit: Payer: Self-pay | Admitting: Family Medicine

## 2015-12-19 NOTE — Telephone Encounter (Signed)
rx faxed to [facility] 

## 2016-03-03 ENCOUNTER — Other Ambulatory Visit: Payer: Self-pay | Admitting: Family Medicine

## 2016-03-06 ENCOUNTER — Other Ambulatory Visit: Payer: Self-pay | Admitting: Family Medicine

## 2016-03-27 ENCOUNTER — Encounter: Payer: Self-pay | Admitting: Family Medicine

## 2016-03-27 ENCOUNTER — Ambulatory Visit (INDEPENDENT_AMBULATORY_CARE_PROVIDER_SITE_OTHER): Payer: BLUE CROSS/BLUE SHIELD | Admitting: Family Medicine

## 2016-03-27 VITALS — BP 124/86 | HR 70 | Temp 98.6°F | Resp 16 | Ht 64.5 in | Wt 147.2 lb

## 2016-03-27 DIAGNOSIS — E559 Vitamin D deficiency, unspecified: Secondary | ICD-10-CM | POA: Diagnosis not present

## 2016-03-27 DIAGNOSIS — K2970 Gastritis, unspecified, without bleeding: Secondary | ICD-10-CM

## 2016-03-27 DIAGNOSIS — G4700 Insomnia, unspecified: Secondary | ICD-10-CM

## 2016-03-27 DIAGNOSIS — I10 Essential (primary) hypertension: Secondary | ICD-10-CM | POA: Diagnosis not present

## 2016-03-27 DIAGNOSIS — R51 Headache: Secondary | ICD-10-CM

## 2016-03-27 DIAGNOSIS — E034 Atrophy of thyroid (acquired): Secondary | ICD-10-CM | POA: Diagnosis not present

## 2016-03-27 DIAGNOSIS — E038 Other specified hypothyroidism: Secondary | ICD-10-CM

## 2016-03-27 DIAGNOSIS — K2990 Gastroduodenitis, unspecified, without bleeding: Secondary | ICD-10-CM | POA: Diagnosis not present

## 2016-03-27 DIAGNOSIS — F411 Generalized anxiety disorder: Secondary | ICD-10-CM | POA: Diagnosis not present

## 2016-03-27 DIAGNOSIS — R519 Headache, unspecified: Secondary | ICD-10-CM

## 2016-03-27 MED ORDER — TRAMADOL HCL 50 MG PO TABS: ORAL_TABLET | ORAL | Status: DC

## 2016-03-27 MED ORDER — SERTRALINE HCL 100 MG PO TABS
100.0000 mg | ORAL_TABLET | Freq: Every day | ORAL | Status: DC
Start: 2016-03-27 — End: 2017-02-19

## 2016-03-27 MED ORDER — DICLOFENAC SODIUM 1 % TD GEL: 4.0000 g | Freq: Four times a day (QID) | TRANSDERMAL | Status: DC

## 2016-03-27 MED ORDER — BUTALBITAL-APAP-CAFFEINE 50-325-40 MG PO TABS: ORAL_TABLET | ORAL | Status: DC

## 2016-03-27 MED ORDER — LEVOTHYROXINE SODIUM 88 MCG PO TABS: ORAL_TABLET | ORAL | Status: DC

## 2016-03-27 MED ORDER — FLUTICASONE PROPIONATE 50 MCG/ACT NA SUSP: NASAL | Status: DC

## 2016-03-27 MED ORDER — LORAZEPAM 0.5 MG PO TABS: 0.5000 mg | ORAL_TABLET | Freq: Three times a day (TID) | ORAL | Status: DC

## 2016-03-27 NOTE — Patient Instructions (Addendum)
IF you received an x-ray today, you will receive an invoice from [facility]. Please contact [facility] at [phone] with questions or concerns regarding your invoice.   IF you received labwork today, you will receive an invoice from [employer]. Please contact [facility] at [phone] with questions or concerns regarding your invoice.   Our billing staff will not be able to assist you with questions regarding bills from these companies.  You will be contacted with the lab results as soon as they are available. The fastest way to get your results is to activate your My Chart account. Instructions are located on the last page of this paperwork. If you have not heard from [place] regarding the results in 2 weeks, please contact this office.    Bursitis Bursitis is inflammation and irritation of a bursa, which is one of the small, fluid-filled sacs that cushion and protect the moving parts of your body. These sacs are located between bones and muscles, muscle attachments, or skin areas next to bones. A bursa protects these structures from the wear and tear that results from frequent movement. An inflamed bursa causes pain and swelling. Fluid may build up inside the sac. Bursitis is most common near joints, especially the knees, elbows, hips, and shoulders. CAUSES Bursitis can be caused by:   Injury from:  A direct blow, like falling on your knee or elbow.  Overuse of a joint (repetitive stress).  Infection. This can happen if bacteria gets into a bursa through a cut or scrape near a joint.  Diseases that cause joint inflammation, such as gout and rheumatoid arthritis. RISK FACTORS You may be at risk for bursitis if you:   Have a job or hobby that involves a lot of repetitive stress on your joints.  Have a condition that weakens your body's defense system (immune system), such as diabetes, cancer, or HIV.  Lift and reach overhead  often.  Kneel or lean on hard surfaces often.  Run or walk often. SIGNS AND SYMPTOMS The most common signs and symptoms of bursitis are:  Pain that gets worse when you move the affected body part or put weight on it.  Inflammation.  Stiffness. Other signs and symptoms may include:  Redness.  Tenderness.  Warmth.  Pain that continues after rest.  Fever and chills. This may occur in bursitis caused by infection. DIAGNOSIS Bursitis may be diagnosed by:   Medical history and physical exam.  MRI.  A procedure to drain fluid from the bursa with a needle (aspiration). The fluid may be checked for signs of infection or gout.  Blood tests to rule out other causes of inflammation. TREATMENT  Bursitis can usually be treated at home with rest, ice, compression, and elevation (RICE). For mild bursitis, RICE treatment may be all you need. Other treatments may include:  Nonsteroidal anti-inflammatory drugs (NSAIDs) to treat pain and inflammation.  Corticosteroids to fight inflammation. You may have these drugs injected into and around the area of bursitis.  Aspiration of bursitis fluid to relieve pain and improve movement.  Antibiotic medicine to treat an infected bursa.  A splint, brace, or walking aid.  Physical therapy if you continue to have pain or limited movement.  Surgery to remove a damaged or infected bursa. This may be needed if you have a very bad case of bursitis or if other treatments have not worked. HOME CARE INSTRUCTIONS   Take medicines only as directed by your health care provider.  If you were prescribed an antibiotic medicine, finish it all even if you start to feel better.  Rest the affected area as directed by your health care provider.  Keep the area elevated.  Avoid activities that make pain worse.  Apply ice to the injured area:  Place ice in a plastic bag.  Place a towel between your skin and the bag.  Leave the ice on for 20 minutes,  2-3 times a day.  Use splints, braces, pads, or walking aids as directed by your health care provider.  Keep all follow-up visits as directed by your health care provider. This is important. PREVENTION   Wear knee pads if you kneel often.  Wear sturdy running or walking shoes that fit you well.  Take regular breaks from repetitive activity.  Warm up by stretching before doing any strenuous activity.  Maintain a healthy weight or lose weight as recommended by your health care provider. Ask your health care provider if you need help.  Exercise regularly. Start any new physical activity gradually. SEEK MEDICAL CARE IF:   Your bursitis is not responding to treatment or home care.  You have a fever.  You have chills.   This information is not intended to replace advice given to you by your health care provider. Make sure you discuss any questions you have with your health care provider.   Document Released: 11/07/2000 Document Revised: 08/01/2015 Document Reviewed: 01/30/2014 Elsevier Interactive Patient Education 2016 Elsevier Inc.  Helicobacter Pylori Antibodies Test WHY AM I HAVING THIS TEST? This is a blood test that looks for bacteria called Helicobacter pylori (H. pylori). H. pylori is a germ that can be found in the cells that line the stomach. Having high levels of H. pylori in your stomach puts you at risk for stomach ulcers and small bowel ulcers, long-term (chronic) inflammation of the lining of the stomach, or even ulcers that may occur in the canal that runs from the mouth to the stomach (esophagus). Presence of H. pylori can also increase your risk for stomach cancer if it is left untreated. Most people with H. pylori in their stomach bacteria have no symptoms. Your health care provider may ask you to have this test if you have symptoms of a stomach ulcer or small bowel ulcer, such as stomach pain before or after eating, heartburn, or nausea repeatedly after eating. WHAT  KIND OF SAMPLE IS TAKEN? A blood sample is required for this test. It is usually collected by inserting a needle into a vein or sticking a finger with a small needle. HOW DO I PREPARE FOR THE TEST? There is no preparation required for this test. WHAT ARE THE REFERENCE RANGES? Reference ranges are considered healthy ranges established after testing a large group of healthy people. Reference ranges may vary among different people, labs, and hospitals. It is your responsibility to obtain your test results. Ask the lab or department performing the test when and how you will get your results. Your test results will be reported as positive, negative, or equivocal. Equivocal means that your results are neither positive nor negative. References ranges for these results are as follows:  Less than or equal to 30 units/mL. This is negative.  Greater than or equal to 40 units/mL. This is positive.  30.01-39.99 units/mL. This is equivocal. WHAT DO THE RESULTS MEAN? Test results that are higher than normal may indicate numerous health conditions. These may include:  Short-term or long-term irritation of the stomach lining (gastritis).  Small  bowel ulcer.  Stomach ulcer.  Stomach cancer. Talk with your health care provider to discuss your results, treatment options, and if necessary, the need for more tests. Talk with your health care provider if you have any questions about your results.   This information is not intended to replace advice given to you by your health care provider. Make sure you discuss any questions you have with your health care provider.   Document Released: 12/04/2004 Document Revised: 12/01/2014 Document Reviewed: 03/24/2014 Elsevier Interactive Patient Education [employer]

## 2016-03-27 NOTE — Progress Notes (Signed)
Subjective:    Patient ID: Erin Anderson, female    DOB: Mar 14, 1961, 55 y.o.   MRN: [id number] Chief Complaint  Patient presents with  . Thyroid    pt had labs drawn last wk at work  . Medication Refill    pt unsure as to which Rx needs to be filled    HPI  Erin Anderson is a 55 yo woman here today for medication refills. Her last visit here was over a year prior.  Hypothyroidism: on levothyroxine 75 mcg HTN: Controlled with TLC - varies and monitors at work  Mood d/o: Insomnia and Anxiety on zoloft 100mg  and lorazepam 0.5 prn which she uses infrequently. Chronic HAs, occ migraines: tramadol and fioricet prn. Sometimes can be triggered by allergies so stays on zyrtec and flonase year-round. Do tend to flair around allergy season  Well-woman care done at [facility]. Her last CPE here was 01/2013.  Last lipid and vit D done at that visit and were nml. Works as a Sports administrator. They now have a clinic there with a nurse practitioner who she can see for minor things.  She had noticed some more joint pains so have a variety of labs done last wk.  She has had some more fatigue and aches.  She is having some more knee pain - burining and tender.Has been trying to do more walking and yoga which she does ok with .  She also tendsw to have pain in her base of palm near radial aspect.  She has not been taking many nsaids - did some aleve but then hads to add in nexium.  She has not correlated nsaid use with higher BP.  She breaks out with meloxicam and celebrex immed due to sulfa allerga  She has been having more gas - bloating, burping, flatulence, varies between diarrhea and constipation, has had worsening gerd so restarted on nexium, ues tums and natural, wakes with nausea, will get    Past Medical History  Diagnosis Date  . Allergy   . Anxiety   . GERD (gastroesophageal reflux disease)   . Thyroid disease    Past Surgical History  Procedure Laterality Date  . Cesarean  section     Current Outpatient Prescriptions on File Prior to Visit  Medication Sig Dispense Refill  . PRESCRIPTION MEDICATION Hormone Replacement therapy taking everyday    . RA CETIRIZINE 10 MG tablet take 1 tablet by mouth once daily 90 tablet 0   No current facility-administered medications on file prior to visit.   Allergies  Allergen Reactions  . Sulfa Antibiotics   . Sulfonamide Derivatives   . Meloxicam Rash   Family History  Problem Relation Age of Onset  . Alcohol abuse Father     died at age 50 from alcohol related issues   Social History   Social History  . Marital Status: Married    Spouse Name: N/A  . Number of Children: N/A  . Years of Education: N/A   Occupational History  . patient account rep [employer]   Social History Main Topics  . Smoking status: Former Smoker -- 1.00 packs/day for 10 years    Types: Cigarettes    Quit date: 12/31/1985  . Smokeless tobacco: Never Used  . Alcohol Use: Yes     Comment: 4-5 weekly  . Drug Use: No  . Sexual Activity: Yes    Birth Control/ Protection: None   Other Topics Concern  . None  Social History Narrative   Divorced. 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 Exercises: 3 times a week, walk and yoga.    Review of Systems  Constitutional: Positive for activity change and fatigue. Negative for fever, chills and appetite change.  Cardiovascular: Positive for chest pain (gerd).  Gastrointestinal: Positive for nausea, abdominal pain, diarrhea, constipation and abdominal distention. Negative for vomiting and blood in stool.  Genitourinary: Positive for menstrual problem.  Musculoskeletal: Positive for myalgias, back pain and arthralgias. Negative for joint swelling and gait problem.  Allergic/Immunologic: Positive for environmental allergies. Negative for immunocompromised state.  Neurological: Positive for headaches.  Psychiatric/Behavioral: Positive for sleep disturbance. The patient is nervous/anxious.          Objective:  BP 124/86 mmHg  Pulse 70  Temp(Src) 98.6 F (37 C) (Oral)  Resp 16  Ht 5' 4.5" (1.638 m)  Wt 147 lb 3.2 oz (66.769 kg)  BMI 24.89 kg/m2  SpO2 98%  Physical Exam  Constitutional: She is oriented to person, place, and time. She appears well-developed and well-nourished. No distress.  HENT:  Head: Normocephalic and atraumatic.  Right Ear: External ear normal.  Left Ear: External ear normal.  Eyes: Conjunctivae are normal. No scleral icterus.  Neck: Normal range of motion. Neck supple. No thyromegaly present.  Cardiovascular: Normal rate, regular rhythm and intact distal pulses.   Murmur heard.  Systolic murmur is present with a grade of 2/6  Pulmonary/Chest: Effort normal and breath sounds normal. No respiratory distress.  Musculoskeletal: She exhibits no edema.  Lymphadenopathy:    She has no cervical adenopathy.  Neurological: She is alert and oriented to person, place, and time.  Skin: Skin is warm and dry. She is not diaphoretic. No erythema.  Psychiatric: She has a normal mood and affect. Her behavior is normal.      Assessment & Plan:  Refill when needed. Refill levothyroxine when tsh is faxed in  Future order for h. Pylori.  1. Hypothyroidism due to acquired atrophy of thyroid   2. Essential hypertension   3. Anxiety state   4. INSOMNIA   5. Nonintractable episodic headache, unspecified headache type   6. Vitamin D deficiency     Meds ordered this encounter  Medications  . levothyroxine (SYNTHROID, LEVOTHROID) 88 MCG tablet    Sig: take 1 tablet by mouth every morning BEFORE BREAKFAST ON AN EMPTY STOMACH    Dispense:  90 tablet    Refill:  0  . diclofenac sodium (VOLTAREN) 1 % GEL    Sig: Apply 4 g topically 4 (four) times daily.    Dispense:  100 g    Refill:  11  . butalbital-acetaminophen-caffeine (FIORICET, ESGIC) 50-325-40 MG tablet    Sig: take 1 to 2 tablets by mouth every 6 hours if needed for headache    Dispense:  60 tablet     Refill:  3  . LORazepam (ATIVAN) 0.5 MG tablet    Sig: Take 1 tablet (0.5 mg total) by mouth every 8 (eight) hours.    Dispense:  30 tablet    Refill:  2  . fluticasone (FLONASE) 50 MCG/ACT nasal spray    Sig: instill 2 sprays into each nostril once daily at bedtime    Dispense:  16 g    Refill:  11  . traMADol (ULTRAM) 50 MG tablet    Sig: take 1 tablet by mouth every 8 hours if needed    Dispense:  60 tablet    Refill:  2  . sertraline (ZOLOFT) 100  MG tablet    Sig: Take 1 tablet (100 mg total) by mouth daily.    Dispense:  90 tablet    Refill:  3     Norberto Sorenson, M.D.  [facility] [address] [place], [place] [address] [phone] phone [phone] fax  04/22/2016 10:17 PM

## 2016-04-09 ENCOUNTER — Other Ambulatory Visit: Payer: Self-pay | Admitting: Family Medicine

## 2016-04-23 ENCOUNTER — Other Ambulatory Visit (INDEPENDENT_AMBULATORY_CARE_PROVIDER_SITE_OTHER): Payer: BLUE CROSS/BLUE SHIELD | Admitting: *Deleted

## 2016-04-23 DIAGNOSIS — K2990 Gastroduodenitis, unspecified, without bleeding: Secondary | ICD-10-CM

## 2016-04-23 DIAGNOSIS — I10 Essential (primary) hypertension: Secondary | ICD-10-CM

## 2016-04-23 DIAGNOSIS — E034 Atrophy of thyroid (acquired): Secondary | ICD-10-CM

## 2016-04-23 DIAGNOSIS — K2970 Gastritis, unspecified, without bleeding: Secondary | ICD-10-CM | POA: Diagnosis not present

## 2016-04-23 LAB — COMPREHENSIVE METABOLIC PANEL
ALT: 16 U/L (ref 6–29)
AST: 27 U/L (ref 10–35)
Albumin: 4.3 g/dL (ref 3.6–5.1)
Alkaline Phosphatase: 65 U/L (ref 33–130)
BUN: 14 mg/dL (ref 7–25)
CHLORIDE: 102 mmol/L (ref 98–110)
CO2: 29 mmol/L (ref 20–31)
CREATININE: 0.58 mg/dL (ref 0.50–1.05)
Calcium: 9.4 mg/dL (ref 8.6–10.4)
Glucose, Bld: 81 mg/dL (ref 65–99)
Potassium: 5 mmol/L (ref 3.5–5.3)
Sodium: 140 mmol/L (ref 135–146)
TOTAL PROTEIN: 6.5 g/dL (ref 6.1–8.1)
Total Bilirubin: 0.4 mg/dL (ref 0.2–1.2)

## 2016-04-23 LAB — TSH: TSH: 1.75 m[IU]/L

## 2016-04-24 LAB — H. PYLORI BREATH TEST: H. pylori Breath Test: NOT DETECTED

## 2016-05-06 ENCOUNTER — Other Ambulatory Visit: Payer: Self-pay | Admitting: Family Medicine

## 2016-05-06 ENCOUNTER — Telehealth: Payer: Self-pay

## 2016-05-06 ENCOUNTER — Encounter: Payer: Self-pay | Admitting: Family Medicine

## 2016-05-06 MED ORDER — LEVOTHYROXINE SODIUM 88 MCG PO TABS: ORAL_TABLET | ORAL | Status: DC

## 2016-05-06 MED ORDER — OMEPRAZOLE 40 MG PO CPDR: 40.0000 mg | DELAYED_RELEASE_CAPSULE | Freq: Every day | ORAL | Status: DC

## 2016-05-06 NOTE — Telephone Encounter (Signed)
Normal letter sent. Refilled levothyroxine.  She should try a 6 week course of omeprazole that I sent to her pharmacy - if she is still having breakthrough symptoms, I will be happy to refer her to GI>

## 2016-05-06 NOTE — Telephone Encounter (Signed)
Dr. Clelia CroftShaw, I do not see a lab note. Please review

## 2016-05-06 NOTE — Telephone Encounter (Signed)
Left message for pt to call back  °

## 2016-05-06 NOTE — Telephone Encounter (Signed)
Dr. Clelia CroftShaw    Patient is anxiously awaiting her lab results.  From May 31th.     [phone]

## 2016-05-07 NOTE — Telephone Encounter (Signed)
Left message for pt to call back  °

## 2016-06-16 ENCOUNTER — Other Ambulatory Visit: Payer: Self-pay | Admitting: Family Medicine

## 2016-06-17 NOTE — Telephone Encounter (Signed)
Pharmacy called to check on status of refill request - surescript.

## 2016-10-10 ENCOUNTER — Other Ambulatory Visit: Payer: Self-pay | Admitting: Family Medicine

## 2016-10-18 NOTE — Telephone Encounter (Signed)
Please let pt know rx is ready for pick up. wll need f/u OV for any additional refills

## 2016-10-22 NOTE — Telephone Encounter (Signed)
Advised pt of need for f/up for more. Pt stated that in the past she has always only had to come once a year. Her other meds were RFd for a yr and pt voiced understanding that she needs to return for RFs of controlled meds if needed sooner.

## 2017-01-23 ENCOUNTER — Other Ambulatory Visit: Payer: Self-pay | Admitting: Family Medicine

## 2017-01-24 NOTE — Telephone Encounter (Signed)
03/2016 last ov and refill

## 2017-01-24 NOTE — Telephone Encounter (Signed)
Called to pharmacy 

## 2017-02-08 ENCOUNTER — Other Ambulatory Visit: Payer: Self-pay | Admitting: Family Medicine

## 2017-02-12 ENCOUNTER — Ambulatory Visit: Payer: BLUE CROSS/BLUE SHIELD | Admitting: Family Medicine

## 2017-02-15 NOTE — Progress Notes (Deleted)
   Subjective:    Patient ID: Erin Anderson, female    DOB: 05/30/1961, 56 y.o.   MRN: [id number]  HPI  Erin Anderson is a delightful 56 yo woman who is here for a 9 mo f/u on her chronic medical problems and get refills of her chronic medication  Hypothyroidism: on levothyroxine 88 mcg which had been increased from 75 mcg 10 mos prior. tsh has been stable for > 4 years - last checked 9 mos and 10 mos prior 3 wks and 7 wks after dose increased and both times were normal.. HTN: Controlled with TLC - varies and monitors at work  Mood d/o: Insomnia and Anxiety on zoloft 100mg  and lorazepam 0.5 prn which she uses infrequently. + fatigue. She last received #30 lorazepam 0.5mg  from Ms. Anderson on 2/3 but then filled it from me on 11/27, 9/28, 5/20. She get a rx for lorazepam 1mg  #30 from Ms. Anderson 12/2015 Chronic HAs, occ migraines: tramadol and fioricet prn. Sometimes can be triggered by allergies so stays on zyrtec and flonase year-round. Do tend to flair around allergy season.  Pt has been using tramadol 50mg  very rarely as she never gets the refills that I put on the bottle (the refills expire before she needs one). She filled #60 tramadol on 11/02/15 (from Celeste[facility]ess), 04/12/16, and 01/24/17 (latter two from me).  Arthralgias: has to keep nsaids to a minimum due to gastritis. She breaks out with meloxicam and celebrex immed due to sulfa allerga. Uses prn topical voltaren. GERD: H. Pylori neg 9 mos prior. On prilosec, ues tums and natural treatments  [facility]. Her last CPE here was 01/2013.  Last lipid at that visit was nml.  vit D done 2014 and 04/2016 nml at 40  Works as a Production designer, theatre/television/filmmanager with [employer] and they have a Publishing rights managernurse practitioner in the office who she can see for minor things.    Review of Systems     Objective:   Physical Exam        Assessment & Plan:  tsh Cons cbc, cmp Today I have utilized the [place] Controlled Substance Registry's online query to confirm compliance  regarding the patient's narcotic pain medications. My review reveals appropriate prescription fills and that [facility] is the sole provider of these medications. Rechecks will occur regularly and the patient is aware of our use of the system.' She does occasionally get medication from Laurance FlattenKaty Anderson (is this the NP in her office??) Has been getting lorazepam and tramadol from both me and Ms. Laurance FlattenKaty Anderson - needs to consolidate and sign controlled drug contract.

## 2017-02-16 ENCOUNTER — Ambulatory Visit: Payer: BLUE CROSS/BLUE SHIELD | Admitting: Family Medicine

## 2017-02-19 ENCOUNTER — Ambulatory Visit (INDEPENDENT_AMBULATORY_CARE_PROVIDER_SITE_OTHER): Payer: BLUE CROSS/BLUE SHIELD | Admitting: Family Medicine

## 2017-02-19 ENCOUNTER — Encounter: Payer: Self-pay | Admitting: Family Medicine

## 2017-02-19 VITALS — BP 152/102 | HR 65 | Temp 98.3°F | Resp 18 | Ht 64.5 in | Wt 155.0 lb

## 2017-02-19 DIAGNOSIS — J302 Other seasonal allergic rhinitis: Secondary | ICD-10-CM

## 2017-02-19 DIAGNOSIS — M7062 Trochanteric bursitis, left hip: Secondary | ICD-10-CM | POA: Diagnosis not present

## 2017-02-19 DIAGNOSIS — E034 Atrophy of thyroid (acquired): Secondary | ICD-10-CM

## 2017-02-19 DIAGNOSIS — G4700 Insomnia, unspecified: Secondary | ICD-10-CM

## 2017-02-19 DIAGNOSIS — M7061 Trochanteric bursitis, right hip: Secondary | ICD-10-CM

## 2017-02-19 DIAGNOSIS — K219 Gastro-esophageal reflux disease without esophagitis: Secondary | ICD-10-CM

## 2017-02-19 DIAGNOSIS — F411 Generalized anxiety disorder: Secondary | ICD-10-CM | POA: Diagnosis not present

## 2017-02-19 DIAGNOSIS — R519 Headache, unspecified: Secondary | ICD-10-CM

## 2017-02-19 DIAGNOSIS — Z5181 Encounter for therapeutic drug level monitoring: Secondary | ICD-10-CM

## 2017-02-19 DIAGNOSIS — M2550 Pain in unspecified joint: Secondary | ICD-10-CM | POA: Diagnosis not present

## 2017-02-19 DIAGNOSIS — I10 Essential (primary) hypertension: Secondary | ICD-10-CM | POA: Diagnosis not present

## 2017-02-19 DIAGNOSIS — R51 Headache: Secondary | ICD-10-CM

## 2017-02-19 MED ORDER — DICLOFENAC SODIUM 1 % TD GEL: 4.0000 g | Freq: Four times a day (QID) | TRANSDERMAL | 11 refills | Status: DC

## 2017-02-19 MED ORDER — BUTALBITAL-APAP-CAFFEINE 50-325-40 MG PO TABS: ORAL_TABLET | ORAL | 2 refills | Status: DC

## 2017-02-19 MED ORDER — LORAZEPAM 0.5 MG PO TABS: 0.5000 mg | ORAL_TABLET | Freq: Three times a day (TID) | ORAL | 2 refills | Status: DC

## 2017-02-19 MED ORDER — SERTRALINE HCL 100 MG PO TABS: 200.0000 mg | ORAL_TABLET | Freq: Every day | ORAL | 1 refills | Status: DC

## 2017-02-19 NOTE — Progress Notes (Signed)
Subjective:    Patient ID: Erin Anderson, female    DOB: 1961/03/31, 56 y.o.   MRN: [id number] Chief Complaint  Patient presents with  . Follow-up    Thyroid and medication recheck.  . Medication Refill    Ativan     HPI Erin Anderson is a delightful 56 yo woman who is here for a 9 mo f/u on her chronic medical problems and get refills of her chronic medication.  Hypothyroidism: on levothyroxine 88 mcg which had been increased from 75 mcg 10 mos prior. tsh has been stable for > 4 years - last checked 9 mos and 10 mos prior 3 wks and 7 wks after dose increased and both times were normal. Has a variety of potential sxs but nothing in particular. HTN: Controlled with TLC - varies and has home monitor but has not been using.  She absolutely does not want to take more blood pressure pills. Mood d/o: Insomnia and Anxiety on zoloft 100mg  and lorazepam 0.5 prn which she uses infrequently. + fatigue. She last received #30 lorazepam 0.5mg  from Ms. Bess on 2/3 but then filled it from me on 11/27, 9/28, 5/20. She get a rx for lorazepam 1mg  #30 from Ms. Bess 12/2015.   Has noticed a lot more anxiety over the past several weeks.  Bipolar daughter 57 yo and son with depression/anxiety who is 57 yo - diagnosed at 57 yo - and it has kept him from a lot of his goals and he has moved to [place].  Ex-husband is also bipolar.  Her daughter had been treated with Dr. Dub MikesLugo who has had to retire so she recently got started at [facility] [employer] and she is hoping/thinking this is going to be well.  She is in a relationship for 5 yrs and that causes stress. She is phsically feeling more stressed. Sleeping ok. Trying to exercise more with yoga - having nutritionist coming to the office that she is going to take advantage of. No  Panic but high level/intense anxetiy.  She was originally started on celexa as wasn't sleeping when she went through menopause. Then after several years she thought perhaps it was up walking and so she  was switched to Zoloft which she has been on since. Not really sure if it is helping or not but doesn't think any side effects. Seen therapist and is going to start couples counseling as well. Chronic HAs, occ migraines: tramadol and fioricet prn. Sometimes can be triggered by allergies so stays on zyrtec and flonase year-round. Do tend to flair around allergy season.  Pt has been using tramadol 50mg  very rarely as she never gets the refills that I put on the bottle (the refills expire before she needs one). She filled #60 tramadol on 11/02/15 (from [facility]), 04/12/16, and 01/24/17 (latter two from me).  HAs are terrible gets worse in the spring now with sinus congestion/allergies that triggers migraines.  Had a severe migraines 3/4 days last wk and at times will make her feel ill all day. Does ice/heat, lays in the dark, otc excedrin migraine, Fioricet and tramadol. The pain and the meds make her nauseas as well and sometimes leave her unable to get out of bed.  Arthralgias: has to keep nsaids to a minimum due to gastritis. She breaks out with meloxicam and celebrex immed due to sulfa allerga. Forgot about topical voltaren for hips and gel.  Has been sleeping on all sides with a pillow between knees. Has been noticing Rt >Lt  laterall hip pain that bothers her most when she is sitting for a long time.  GERD: H. Pylori neg 9 mos prior. Uses tums and natural treatments.  Sxs are currently doing well with probiotics intermittently.  Well-woman care done at [facility]. Her last CPE here was 01/2013. Last lipid at that visit was nml.  vit D done 2014 and 04/2016 nml at 40.  Had her lipids checked every fall at the biometric screen done at her work and has been ok.   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  Past Medical History:  Diagnosis Date  . Allergy   . Anxiety   . GERD  (gastroesophageal reflux disease)   . Thyroid disease    Past Surgical History:  Procedure Laterality Date  . CESAREAN SECTION     Current Outpatient Prescriptions on File Prior to Visit  Medication Sig Dispense Refill  . fluticasone (FLONASE) 50 MCG/ACT nasal spray instill 2 sprays into each nostril once daily at bedtime 16 g 11  . levothyroxine (SYNTHROID, LEVOTHROID) 88 MCG tablet take 1 tablet by mouth every morning BEFORE BREAKFAST ON AN EMPTY STOMACH 90 tablet 3  . PRESCRIPTION MEDICATION Hormone Replacement therapy taking everyday    . RA CETIRIZINE 10 MG tablet take 1 tablet by mouth once daily 90 tablet 2  . traMADol (ULTRAM) 50 MG tablet take 1 tablet by mouth every 8 hours if needed 60 tablet 2   No current facility-administered medications on file prior to visit.    Allergies  Allergen Reactions  . Sulfa Antibiotics   . Sulfonamide Derivatives   . Meloxicam Rash   Family History  Problem Relation Age of Onset  . Alcohol abuse Father     died at age 19 from alcohol related issues   Social History   Social History  . Marital status: Married    Spouse name: N/A  . Number of children: N/A  . Years of education: N/A   Occupational History  . patient account rep [employer]   Social History Main Topics  . Smoking status: Former Smoker    Packs/day: 1.00    Years: 10.00    Types: Cigarettes    Quit date: 12/31/1985  . Smokeless tobacco: Never Used  . Alcohol use Yes     Comment: 4-5 weekly  . Drug use: No  . Sexual activity: Yes    Birth control/ protection: None   Other Topics Concern  . None   Social History Narrative   Divorced. Education: Automotive engineer (some). Exercises: 3 times a week, walk and yoga.   Depression screen [facility] 2/9 03/27/2016  Decreased Interest 0  Down, Depressed, Hopeless 0  PHQ - 2 Score 0     Review of Systems  Constitutional: Positive for activity change (decrease), appetite change (incrase), fatigue and unexpected weight  change (gain). Negative for chills and fever.  HENT: Positive for congestion, postnasal drip, sinus pain and sinus pressure.   Respiratory: Negative for chest tightness and shortness of breath.   Cardiovascular: Negative for chest pain, palpitations and leg swelling.  Gastrointestinal: Negative for abdominal distention, abdominal pain, constipation, diarrhea, nausea and vomiting.  Musculoskeletal: Positive for arthralgias, back pain and myalgias.  Skin: Negative for color change.  Allergic/Immunologic: Positive for environmental allergies. Negative for immunocompromised state.  Neurological: Positive for headaches. Negative for numbness.  Psychiatric/Behavioral: Positive  for agitation and sleep disturbance. Negative for behavioral problems, confusion and dysphoric mood. The patient is nervous/anxious.    See hpi    Objective:   Physical Exam  Constitutional: She is oriented to person, place, and time. She appears well-developed and well-nourished. No distress.  HENT:  Head: Normocephalic and atraumatic.  Right Ear: External ear normal.  Left Ear: External ear normal.  Eyes: Conjunctivae are normal. No scleral icterus.  Neck: Normal range of motion. Neck supple. No thyromegaly present.  Cardiovascular: Normal rate, regular rhythm, normal heart sounds and intact distal pulses.   Pulmonary/Chest: Effort normal and breath sounds normal. No respiratory distress.  Musculoskeletal: She exhibits no edema.  Lymphadenopathy:    She has no cervical adenopathy.  Neurological: She is alert and oriented to person, place, and time.  Skin: Skin is warm and dry. She is not diaphoretic. No erythema.  Psychiatric: She has a normal mood and affect. Her behavior is normal.     BP (!) 152/102   Pulse 65   Temp 98.3 F (36.8 C) (Oral)   Resp 18   Ht 5' 4.5" (1.638 m)   Wt 155 lb (70.3 kg)   SpO2 98%   BMI 26.19 kg/m   Assessment & Plan:  tsh Cons cbc, cmp Today I have utilized the [place] Controlled  Substance Registry's online query to confirm compliance regarding the patient's narcotic pain medications. My review reveals below. Rechecks will occur regularly and the patient is aware of our use of the system.' She does occasionally get medication from Laurance Flatten (this was the NP in her office) Has been getting lorazepam and tramadol from both me and Ms. Laurance Flatten - she left and went to [facility] - they are looking for another.  needs to consolidate and sign controlled drug contract.  Asked pt to please receive controlled substances from only myself - pt agrees.   1. Hypothyroidism due to acquired atrophy of thyroid - tsh nml, refilled levothyroxine 88 mcg x 1 yr  2. Essential hypertension - refuses to start med so will work on tlc - increase exercise, watch diet, monitor outside office.  3. Anxiety state - encouraged pt to double zoloft to 200mg  - pt reluctant. If this doesn't help within 1-2 mos, then wean back to 100mg  and let me know so we can consider weaning off zoloft onto something else (has only ever tried citalopram).  4. Insomnia, unspecified type   5. Nonintractable episodic headache, unspecified headache type   6. Arthralgia, unspecified joint   7. Gastroesophageal reflux disease, esophagitis presence not specified   8. Medication monitoring encounter   9. Sinus headache - requests referral to allergist - she would like spot testing.  10. Chronic seasonal allergic rhinitis, unspecified trigger   11. Trochanteric bursitis of both hips - gave home exercises. Try voltaren gel. If sxs cont, rec RTC for cortisone inj and referral to PT.    Orders Placed This Encounter  Procedures  . TSH  . CBC  . Comprehensive metabolic panel  . Ambulatory referral to Allergy    Referral Priority:   Routine    Referral Type:   Allergy Testing    Referral Reason:   Specialty Services Required    Requested Specialty:   Allergy    Number of Visits Requested:   1    Meds ordered this encounter    Medications  . butalbital-acetaminophen-caffeine (FIORICET, ESGIC) 50-325-40 MG tablet    Sig: take 1 to 2 tablets by  mouth every 6 hours if needed for headache    Dispense:  60 tablet    Refill:  2  . LORazepam (ATIVAN) 0.5 MG tablet    Sig: Take 1 tablet (0.5 mg total) by mouth every 8 (eight) hours.    Dispense:  60 tablet    Refill:  2  . sertraline (ZOLOFT) 100 MG tablet    Sig: Take 2 tablets (200 mg total) by mouth daily.    Dispense:  180 tablet    Refill:  1  . diclofenac sodium (VOLTAREN) 1 % GEL    Sig: Apply 4 g topically 4 (four) times daily.    Dispense:  100 g    Refill:  11      Norberto Sorenson, M.D.  Primary Care at [facility] [address] [place], [place] [address] [phone] phone [phone] fax  02/20/17 11:46 AM

## 2017-02-19 NOTE — Patient Instructions (Addendum)
If you want your hormone profile looked at in detail and consider about more specific hormonal therapy Dr. Jarold Motto at [facility] in [place].   IF you received an x-ray today, you will receive an invoice from [facility]. Please contact [facility] at [phone] with questions or concerns regarding your invoice.   IF you received labwork today, you will receive an invoice from [place]. Please contact [facility] at [phone] with questions or concerns regarding your invoice.   Our billing staff will not be able to assist you with questions regarding bills from these companies.  You will be contacted with the lab results as soon as they are available. The fastest way to get your results is to activate your My Chart account. Instructions are located on the last page of this paperwork. If you have not heard from [place] regarding the results in 2 weeks, please contact this office.      Exercising to Lose Weight Exercising can help you to lose weight. In order to lose weight through exercise, you need to do vigorous-intensity exercise. You can tell that you are exercising with vigorous intensity if you are breathing very hard and fast and cannot hold a conversation while exercising. Moderate-intensity exercise helps to maintain your current weight. You can tell that you are exercising at a moderate level if you have a higher heart rate and faster breathing, but you are still able to hold a conversation. How often should I exercise? Choose an activity that you enjoy and set realistic goals. Your health care provider can help you to make an activity plan that works for you. Exercise regularly as directed by your health care provider. This may include:  Doing resistance training twice each week, such as:  Push-ups.  Sit-ups.  Lifting weights.  Using resistance bands.  Doing a given intensity of exercise for a given amount of time. Choose from these options:  150 minutes  of moderate-intensity exercise every week.  75 minutes of vigorous-intensity exercise every week.  A mix of moderate-intensity and vigorous-intensity exercise every week. Children, pregnant women, people who are out of shape, people who are overweight, and older adults may need to consult a health care provider for individual recommendations. If you have any sort of medical condition, be sure to consult your health care provider before starting a new exercise program. What are some activities that can help me to lose weight?  Walking at a rate of at least 4.5 miles an hour.  Jogging or running at a rate of 5 miles per hour.  Biking at a rate of at least 10 miles per hour.  Lap swimming.  Roller-skating or in-line skating.  Cross-country skiing.  Vigorous competitive sports, such as football, basketball, and soccer.  Jumping rope.  Aerobic dancing. How can I be more active in my day-to-day activities?  Use the stairs instead of the elevator.  Take a walk during your lunch break.  If you drive, park your car farther away from work or school.  If you take public transportation, get off one stop early and walk the rest of the way.  Make all of your phone calls while standing up and walking around.  Get up, stretch, and walk around every 30 minutes throughout the day. What guidelines should I follow while exercising?  Do not exercise so much that you hurt yourself, feel dizzy, or get very short of breath.  Consult your health care provider prior to starting a new exercise program.  Wear  comfortable clothes and shoes with good support.  Drink plenty of water while you exercise to prevent dehydration or heat stroke. Body water is lost during exercise and must be replaced.  Work out until you breathe faster and your heart beats faster. This information is not intended to replace advice given to you by your health care provider. Make sure you discuss any questions you have  with your health care provider. Document Released: 12/13/2010 Document Revised: 04/17/2016 Document Reviewed: 04/13/2014 Elsevier Interactive Patient Education  2017 Elsevier Inc.    Trochanteric Bursitis Rehab Ask your health care provider which exercises are safe for you. Do exercises exactly as told by your health care provider and adjust them as directed. It is normal to feel mild stretching, pulling, tightness, or discomfort as you do these exercises, but you should stop right away if you feel sudden pain or your pain gets worse.Do not begin these exercises until told by your health care provider. Stretching exercises These exercises warm up your muscles and joints and improve the movement and flexibility of your hip. These exercises also help to relieve pain and stiffness. Exercise A: Iliotibial band stretch   1. Lie on your side with your left / right leg in the top position. 2. Bend your left / right knee and grab your ankle. 3. Slowly bring your knee back so your thigh is behind your body. 4. Slowly lower your knee toward the floor until you feel a gentle stretch on the outside of your left / right thigh. If you do not feel a stretch and your knee will not fall farther, place the heel of your other foot on top of your outer knee and pull your thigh down farther. 5. Hold this position for __________ seconds. 6. Slowly return to the starting position. Repeat __________ times. Complete this exercise __________ times a day. Strengthening exercises These exercises build strength and endurance in your hip and pelvis. Endurance is the ability to use your muscles for a long time, even after they get tired. Exercise B: Bridge (  hip extensors) 1. Lie on your back on a firm surface with your knees bent and your feet flat on the floor. 2. Tighten your buttocks muscles and lift your buttocks off the floor until your trunk is level with your thighs. You should feel the muscles working in your  buttocks and the back of your thighs. If this exercise is too easy, try doing it with your arms crossed over your chest. 3. Hold this position for __________ seconds. 4. Slowly return to the starting position. 5. Let your muscles relax completely between repetitions. Repeat __________ times. Complete this exercise __________ times a day. Exercise C: Squats ( knee extensors and  quadriceps) 1. Stand in front of a table, with your feet and knees pointing straight ahead. You may rest your hands on the table for balance but not for support. 2. Slowly bend your knees and lower your hips like you are going to sit in a chair.  Keep your weight over your heels, not over your toes.  Keep your lower legs upright so they are parallel with the table legs.  Do not let your hips go lower than your knees.  Do not bend lower than told by your health care provider.  If your hip pain increases, do not bend as low. 3. Hold this position for __________ seconds. 4. Slowly push with your legs to return to standing. Do not use your hands to pull yourself  to standing. Repeat __________ times. Complete this exercise __________ times a day. Exercise D: Hip hike 1. Stand sideways on a bottom step. Stand on your left / right leg with your other foot unsupported next to the step. You can hold onto the railing or wall if needed for balance. 2. Keeping your knees straight and your torso square, lift your left / right hip up toward the ceiling. 3. Hold this position for __________ seconds. 4. Slowly let your left / right hip lower toward the floor, past the starting position. Your foot should get closer to the floor. Do not lean or bend your knees. Repeat __________ times. Complete this exercise __________ times a day. Exercise E: Single leg stand 1. Stand near a counter or door frame that you can hold onto for balance as needed. It is helpful to stand in front of a mirror for this exercise so you can watch your  hip. 2. Squeeze your left / right buttock muscles then lift up your other foot. Do not let your left / right hip push out to the side. 3. Hold this position for __________ seconds. Repeat __________ times. Complete this exercise __________ times a day. This information is not intended to replace advice given to you by your health care provider. Make sure you discuss any questions you have with your health care provider. Document Released: 12/18/2004 Document Revised: 07/17/2016 Document Reviewed: 10/26/2015 Elsevier Interactive Patient Education  2017 [employer]

## 2017-02-20 ENCOUNTER — Encounter: Payer: Self-pay | Admitting: Family Medicine

## 2017-02-20 LAB — COMPREHENSIVE METABOLIC PANEL
ALBUMIN: 4.6 g/dL (ref 3.5–5.5)
ALT: 18 IU/L (ref 0–32)
AST: 29 IU/L (ref 0–40)
Albumin/Globulin Ratio: 1.9 (ref 1.2–2.2)
Alkaline Phosphatase: 74 IU/L (ref 39–117)
BUN / CREAT RATIO: 19 (ref 9–23)
BUN: 12 mg/dL (ref 6–24)
CALCIUM: 9.6 mg/dL (ref 8.7–10.2)
CHLORIDE: 100 mmol/L (ref 96–106)
CO2: 26 mmol/L (ref 18–29)
Creatinine, Ser: 0.63 mg/dL (ref 0.57–1.00)
GFR, EST AFRICAN AMERICAN: 115 mL/min/{1.73_m2} (ref >59)
GFR, EST NON AFRICAN AMERICAN: 100 mL/min/{1.73_m2} (ref >59)
GLUCOSE: 92 mg/dL (ref 65–99)
Globulin, Total: 2.4 g/dL (ref 1.5–4.5)
Potassium: 4 mmol/L (ref 3.5–5.2)
Sodium: 139 mmol/L (ref 134–144)
TOTAL PROTEIN: 7 g/dL (ref 6.0–8.5)

## 2017-02-20 LAB — CBC
HEMOGLOBIN: 14.2 g/dL (ref 11.1–15.9)
Hematocrit: 41.5 % (ref 34.0–46.6)
MCH: 31.2 pg (ref 26.6–33.0)
MCHC: 34.2 g/dL (ref 31.5–35.7)
MCV: 91 fL (ref 79–97)
Platelets: 367 10*3/uL (ref 150–379)
RBC: 4.55 x10E6/uL (ref 3.77–5.28)
RDW: 14.2 % (ref 12.3–15.4)
WBC: 7.9 10*3/uL (ref 3.4–10.8)

## 2017-02-20 LAB — TSH: TSH: 1.93 u[IU]/mL (ref 0.450–4.500)

## 2017-02-20 MED ORDER — LEVOTHYROXINE SODIUM 88 MCG PO TABS: ORAL_TABLET | ORAL | 3 refills | Status: DC

## 2017-02-27 ENCOUNTER — Telehealth: Payer: Self-pay | Admitting: Family Medicine

## 2017-02-27 NOTE — Telephone Encounter (Signed)
Pt is looking for lab results   Best number [phone]

## 2017-02-27 NOTE — Telephone Encounter (Signed)
Letter sent. Your kidneys, liver, salts in your blood, thyroid, and blood counts are perfect. A refill of your levothyroxine 88 mcg has been sent to your pharmacy.

## 2017-02-28 NOTE — Telephone Encounter (Signed)
Pt advised.

## 2017-04-21 ENCOUNTER — Other Ambulatory Visit: Payer: Self-pay | Admitting: Family Medicine

## 2017-05-13 ENCOUNTER — Other Ambulatory Visit: Payer: Self-pay | Admitting: Family Medicine

## 2017-08-25 ENCOUNTER — Other Ambulatory Visit: Payer: Self-pay | Admitting: Family Medicine

## 2017-08-28 NOTE — Telephone Encounter (Signed)
Pt called to let us know that there should be a  Fax sent over a RX for her migraines. She is getting a little anxious as she only has 2 pills left.   Please advise her at [phone]

## 2017-08-28 NOTE — Telephone Encounter (Signed)
Per her request, please let her know that we will fax in a refill.

## 2017-09-24 ENCOUNTER — Other Ambulatory Visit: Payer: Self-pay | Admitting: Family Medicine

## 2017-09-25 NOTE — Telephone Encounter (Signed)
Needs OV for additional refilld

## 2017-11-19 ENCOUNTER — Other Ambulatory Visit: Payer: Self-pay | Admitting: Family Medicine

## 2017-11-30 ENCOUNTER — Ambulatory Visit: Payer: BLUE CROSS/BLUE SHIELD | Admitting: Family Medicine

## 2017-12-09 ENCOUNTER — Ambulatory Visit: Payer: BLUE CROSS/BLUE SHIELD | Admitting: Family Medicine

## 2017-12-09 ENCOUNTER — Encounter: Payer: Self-pay | Admitting: Family Medicine

## 2017-12-09 VITALS — BP 122/72 | HR 90 | Temp 98.2°F | Resp 17 | Ht 65.5 in | Wt 158.0 lb

## 2017-12-09 DIAGNOSIS — Z5181 Encounter for therapeutic drug level monitoring: Secondary | ICD-10-CM

## 2017-12-09 DIAGNOSIS — G43009 Migraine without aura, not intractable, without status migrainosus: Secondary | ICD-10-CM | POA: Diagnosis not present

## 2017-12-09 DIAGNOSIS — M542 Cervicalgia: Secondary | ICD-10-CM | POA: Diagnosis not present

## 2017-12-09 DIAGNOSIS — I10 Essential (primary) hypertension: Secondary | ICD-10-CM

## 2017-12-09 DIAGNOSIS — E034 Atrophy of thyroid (acquired): Secondary | ICD-10-CM | POA: Diagnosis not present

## 2017-12-09 MED ORDER — DICLOFENAC SODIUM 1 % TD GEL: 4.0000 g | Freq: Four times a day (QID) | TRANSDERMAL | 11 refills | Status: DC

## 2017-12-09 MED ORDER — SERTRALINE HCL 100 MG PO TABS: 100.0000 mg | ORAL_TABLET | Freq: Every day | ORAL | 3 refills | Status: DC

## 2017-12-09 MED ORDER — TRAMADOL HCL 50 MG PO TABS: ORAL_TABLET | ORAL | 2 refills | Status: DC

## 2017-12-09 MED ORDER — BUTALBITAL-APAP-CAFFEINE 50-325-40 MG PO TABS: ORAL_TABLET | ORAL | 2 refills | Status: DC

## 2017-12-09 MED ORDER — TIZANIDINE HCL 6 MG PO CAPS: 6.0000 mg | ORAL_CAPSULE | Freq: Every day | ORAL | 1 refills | Status: DC

## 2017-12-09 MED ORDER — ELETRIPTAN HYDROBROMIDE 40 MG PO TABS: 40.0000 mg | ORAL_TABLET | ORAL | 2 refills | Status: DC | PRN

## 2017-12-09 NOTE — Patient Instructions (Signed)
     IF you received an x-ray today, you will receive an invoice from [facility]. Please contact [facility] at [phone] with questions or concerns regarding your invoice.   IF you received labwork today, you will receive an invoice from [facility]. Please contact [facility] at [phone] with questions or concerns regarding your invoice.   Our billing staff will not be able to assist you with questions regarding bills from these companies.  You will be contacted with the lab results as soon as they are available. The fastest way to get your results is to activate your My Chart account. Instructions are located on the last page of this paperwork. If you have not heard from us regarding the results in 2 weeks, please contact this office.     

## 2017-12-09 NOTE — Progress Notes (Signed)
Subjective:  By signing my name below, I, Erin Anderson, attest that this documentation has been prepared under the direction and in the presence of Norberto Sorenson, MD Electronically Signed: Charline Bills, ED Scribe 12/09/2017 at 4:43 PM.   Patient ID: Erin Anderson, female    DOB: 28-Mar-1961, 57 y.o.   MRN: [id number]  Chief Complaint  Patient presents with  . Migraine   HPI Erin Anderson is a 57 y.o. female who presents to Primary Care at [facility] complaining of intermittent migraine HAs. Pt reports having migraine HAs that occurred every other wk from Christmas to NYE and another last wk. She started having migraines at age 27 but denies changes in migraines. She has noticed that migraines that occur on the R side of her head is more severe than the ones on the L side. Reports that migraines are so severe that they wake her with photophobia, nausea, rarely with vomiting. She takes Fioricet and Tramadol for migraines. States a PA at her job gave her 10 tabs of Oxycotin which did not improve migraines. Pt's previous PCP tried her on Imitrex which made her arms feel weird.  Neck Pain/Tightness Pt also presents with intermittent neck pain that was constant for 6 wks but has improved. No known injury. She has tried sleeping with different pillows which has not improved neck pain/tightness. Denies radiation into UE but states pain occasionally radiates into her L ear. Pt had c-spine XRs a little over 3 yrs ago, normal.  Anxiety  Pt reports that Zoloft seems to improve anxiety. Takes Ativan on occasion. She has also tried yoga and relaxation techniques.  Past Medical History:  Diagnosis Date  . Allergy   . Anxiety   . GERD (gastroesophageal reflux disease)   . Thyroid disease    Current Outpatient Medications on File Prior to Visit  Medication Sig Dispense Refill  . butalbital-acetaminophen-caffeine (FIORICET, ESGIC) 50-325-40 MG tablet take 1 to 2 tablets by mouth every 6 hours if needed for  headache 60 tablet 0  . diclofenac sodium (VOLTAREN) 1 % GEL Apply 4 g topically 4 (four) times daily. 100 g 11  . fluticasone (FLONASE) 50 MCG/ACT nasal spray instill 2 sprays into each nostril once daily at bedtime 16 g 2  . levothyroxine (SYNTHROID, LEVOTHROID) 88 MCG tablet take 1 tablet by mouth every morning BEFORE BREAKFAST ON AN EMPTY STOMACH 90 tablet 3  . LORazepam (ATIVAN) 0.5 MG tablet Take 1 tablet (0.5 mg total) by mouth every 8 (eight) hours as needed for anxiety. 60 tablet 1  . PRESCRIPTION MEDICATION Hormone Replacement therapy taking everyday    . RA ALLERGY RELIEF 10 MG tablet take 1 tablet by mouth once daily NO MORE REFILLS WITHOUT OFFICE VISIT 90 tablet 2  . sertraline (ZOLOFT) 100 MG tablet Take 2 tablets (200 mg total) by mouth daily. 180 tablet 1  . traMADol (ULTRAM) 50 MG tablet take 1 tablet by mouth every 8 hours if needed 60 tablet 2   No current facility-administered medications on file prior to visit.    Allergies  Allergen Reactions  . Sulfa Antibiotics   . Sulfonamide Derivatives   . Meloxicam Rash   Past Surgical History:  Procedure Laterality Date  . CESAREAN SECTION     Family History  Problem Relation Age of Onset  . Alcohol abuse Father        died at age 32 from alcohol related issues   Social History   Socioeconomic History  . Marital  status: Married    Spouse name: None  . Number of children: None  . Years of education: None  . Highest education level: None  Social Needs  . Financial resource strain: None  . Food insecurity - worry: None  . Food insecurity - inability: None  . Transportation needs - medical: None  . Transportation needs - non-medical: None  Occupational History  . Occupation: patient account rep    Employer: [employer]  Tobacco Use  . Smoking status: Former Smoker    Packs/day: 1.00    Years: 10.00    Pack years: 10.00    Types: Cigarettes    Last attempt to quit: 12/31/1985    Years since quitting:  31.9  . Smokeless tobacco: Never Used  Substance and Sexual Activity  . Alcohol use: Yes    Comment: 4-5 weekly  . Drug use: No  . Sexual activity: Yes    Birth control/protection: None  Other Topics Concern  . None  Social History Narrative   Divorced. Education: Automotive engineer (some). Exercises: 3 times a week, walk and yoga.   Depression screen [facility] 2/9 12/09/2017 03/27/2016  Decreased Interest 0 0  Down, Depressed, Hopeless 0 0  PHQ - 2 Score 0 0    Review of Systems  Musculoskeletal: Positive for neck pain.  Neurological: Positive for headaches.      Objective:   Physical Exam  Constitutional: She is oriented to person, place, and time. She appears well-developed and well-nourished. No distress.  HENT:  Head: Normocephalic and atraumatic.  Eyes: Conjunctivae and EOM are normal.  Neck: Normal range of motion. Neck supple. No tracheal deviation present.  No cervical spinous tenderness to palpation or paraspinal tenderness though feels tight bilaterally.  Cardiovascular: Normal rate.  Pulmonary/Chest: Effort normal. No respiratory distress.  Musculoskeletal: Normal range of motion.  Neurological: She is alert and oriented to person, place, and time.  Reflex Scores:      Tricep reflexes are 2+ on the right side and 2+ on the left side.      Bicep reflexes are 2+ on the right side and 2+ on the left side.      Brachioradialis reflexes are 2+ on the right side and 2+ on the left side. Skin: Skin is warm and dry.  Psychiatric: She has a normal mood and affect. Her behavior is normal.  Nursing note and vitals reviewed.  BP 122/72   Pulse 90   Temp 98.2 F (36.8 C) (Oral)   Resp 17   Ht 5' 5.5" (1.664 m)   Wt 158 lb (71.7 kg)   SpO2 98%   BMI 25.89 kg/m     Assessment & Plan:   1. Hypothyroidism due to acquired atrophy of thyroid   2. Essential hypertension   3. Neck pain on left side   4. Medication monitoring encounter   5. Migraine without aura and without status  migrainosus, not intractable     Orders Placed This Encounter  Procedures  . TSH  . Comprehensive metabolic panel    Meds ordered this encounter  Medications  . eletriptan (RELPAX) 40 MG tablet    Sig: Take 1 tablet (40 mg total) by mouth as needed for migraine or headache. May repeat in 2 hours if headache persists or recurs.    Dispense:  10 tablet    Refill:  2  . butalbital-acetaminophen-caffeine (FIORICET, ESGIC) 50-325-40 MG tablet    Sig: take 1 to 2 tablets by mouth every 6 hours  if needed for headache    Dispense:  60 tablet    Refill:  2  . traMADol (ULTRAM) 50 MG tablet    Sig: take 1 tablet by mouth every 8 hours if needed    Dispense:  60 tablet    Refill:  2  . sertraline (ZOLOFT) 100 MG tablet    Sig: Take 1 tablet (100 mg total) by mouth daily.    Dispense:  90 tablet    Refill:  3  . diclofenac sodium (VOLTAREN) 1 % GEL    Sig: Apply 4 g topically 4 (four) times daily.    Dispense:  100 g    Refill:  11  . tizanidine (ZANAFLEX) 6 MG capsule    Sig: Take 1 capsule (6 mg total) by mouth at bedtime.    Dispense:  30 capsule    Refill:  1    I personally performed the services described in this documentation, which was scribed in my presence. The recorded information has been reviewed and considered, and addended by me as needed.   Norberto SorensonEva Shaw, M.D.  Primary [facility] at [facility]  [place] [address] [place], [place] [address] [phone] phone [phone] fax  12/12/17 1:19 PM

## 2017-12-10 LAB — COMPREHENSIVE METABOLIC PANEL
A/G RATIO: 1.6 (ref 1.2–2.2)
ALK PHOS: 78 IU/L (ref 39–117)
ALT: 28 IU/L (ref 0–32)
AST: 32 IU/L (ref 0–40)
Albumin: 4.5 g/dL (ref 3.5–5.5)
BILIRUBIN TOTAL: 0.2 mg/dL (ref 0.0–1.2)
BUN/Creatinine Ratio: 20 (ref 9–23)
BUN: 14 mg/dL (ref 6–24)
CALCIUM: 9.5 mg/dL (ref 8.7–10.2)
CHLORIDE: 101 mmol/L (ref 96–106)
CO2: 24 mmol/L (ref 20–29)
Creatinine, Ser: 0.71 mg/dL (ref 0.57–1.00)
GFR calc Af Amer: 109 mL/min/{1.73_m2} (ref >59)
GFR, EST NON AFRICAN AMERICAN: 95 mL/min/{1.73_m2} (ref >59)
GLOBULIN, TOTAL: 2.9 g/dL (ref 1.5–4.5)
Glucose: 78 mg/dL (ref 65–99)
POTASSIUM: 3.8 mmol/L (ref 3.5–5.2)
Sodium: 137 mmol/L (ref 134–144)
Total Protein: 7.4 g/dL (ref 6.0–8.5)

## 2017-12-10 LAB — TSH: TSH: 4.16 u[IU]/mL (ref 0.450–4.500)

## 2018-01-27 DIAGNOSIS — E668 Other obesity: Secondary | ICD-10-CM | POA: Diagnosis not present

## 2018-02-25 DIAGNOSIS — E668 Other obesity: Secondary | ICD-10-CM | POA: Diagnosis not present

## 2018-03-01 ENCOUNTER — Other Ambulatory Visit: Payer: Self-pay | Admitting: Family Medicine

## 2018-03-01 NOTE — Telephone Encounter (Signed)
Dr. Clelia CroftShaw    Pended synthroid, is patient staying on same dose?

## 2018-03-03 ENCOUNTER — Other Ambulatory Visit: Payer: Self-pay | Admitting: Family Medicine

## 2018-03-03 NOTE — Telephone Encounter (Signed)
Cetrizine (Zyrtec) refill Last OV: 12/09/17 was seen in office but Zyrtec not addressed Last Refill:Not on active med list [facility] [address] Clelia CroftShaw

## 2018-03-04 ENCOUNTER — Telehealth: Payer: Self-pay | Admitting: Family Medicine

## 2018-03-04 NOTE — Telephone Encounter (Signed)
Copied from CRM #[id number]. Topic: Quick Communication - Rx Refill/Question >> Mar 04, 2018  3:09 PM Hayes, Teresa G wrote: levothyroxine (SYNTHROID, LEVOTHROID) 88 MCG tablet Generic of Zyrtec  Pt needing refills  [id number] - [place], [place] - [address] [place] [address] Phone: [phone] Fax: [phone] 

## 2018-03-05 NOTE — Telephone Encounter (Signed)
Copied from CRM [id number]. Topic: Quick Communication - Rx Refill/Question >> Mar 04, 2018  3:09 PM Raquel SarnaHayes, Teresa G wrote: levothyroxine (SYNTHROID, LEVOTHROID) 88 MCG tablet Generic of Zyrtec  Pt needing refills  [id number] [place], [place] -- [id number] [facility] [address] [facility] [address] [place] [address] Phone: [phone] Fax: [phone]

## 2018-03-05 NOTE — Telephone Encounter (Signed)
Duplicate encounter, see refill encounters for 03/01/18 and 03/03/18.

## 2018-03-05 NOTE — Telephone Encounter (Signed)
Patient calling to check the status of her Levothyroxine and Zyrtec. Stated that she really needs them because she is out

## 2018-03-07 MED ORDER — CETIRIZINE HCL 10 MG PO TABS: 10.0000 mg | ORAL_TABLET | Freq: Every day | ORAL | 3 refills | Status: DC

## 2018-03-07 NOTE — Telephone Encounter (Signed)
Refilled synthroid x 9 mos, no dose change, tsh stable for >4 years

## 2018-03-20 ENCOUNTER — Other Ambulatory Visit: Payer: Self-pay | Admitting: Family Medicine

## 2018-03-24 DIAGNOSIS — Z713 Dietary counseling and surveillance: Secondary | ICD-10-CM | POA: Diagnosis not present

## 2018-03-24 DIAGNOSIS — Z6837 Body mass index (BMI) 37.0-37.9, adult: Secondary | ICD-10-CM | POA: Diagnosis not present

## 2018-04-01 ENCOUNTER — Other Ambulatory Visit: Payer: Self-pay | Admitting: Family Medicine

## 2018-04-01 ENCOUNTER — Encounter: Payer: Self-pay | Admitting: Family Medicine

## 2018-04-01 DIAGNOSIS — N909 Noninflammatory disorder of vulva and perineum, unspecified: Secondary | ICD-10-CM | POA: Insufficient documentation

## 2018-04-01 DIAGNOSIS — N393 Stress incontinence (female) (male): Secondary | ICD-10-CM

## 2018-04-01 DIAGNOSIS — R928 Other abnormal and inconclusive findings on diagnostic imaging of breast: Secondary | ICD-10-CM | POA: Insufficient documentation

## 2018-04-01 DIAGNOSIS — N951 Menopausal and female climacteric states: Secondary | ICD-10-CM | POA: Insufficient documentation

## 2018-04-01 HISTORY — DX: Stress incontinence (female) (male): N39.3

## 2018-04-26 ENCOUNTER — Telehealth: Payer: Self-pay | Admitting: Family Medicine

## 2018-04-26 DIAGNOSIS — R519 Headache, unspecified: Secondary | ICD-10-CM

## 2018-04-26 DIAGNOSIS — R51 Headache: Principal | ICD-10-CM

## 2018-04-26 NOTE — Telephone Encounter (Signed)
Copied from CRM #[id number]. Topic: Quick Communication - Rx Refill/Question >> Apr 26, 2018  1:50 PM Alexander BergeronBarksdale, Harvey B wrote: Medication: eletriptan (RELPAX) 40 MG tablet [[id number]][[id number]]   Pt states that she is not getting better w/ the medication above and is looking for an alternative medication call pt to advise

## 2018-04-28 ENCOUNTER — Other Ambulatory Visit: Payer: Self-pay | Admitting: Family Medicine

## 2018-04-28 NOTE — Telephone Encounter (Signed)
No - for side effects that serious and severe from med, I do not feel comfortable rxing med in same class - placed referral to neurology for further eval and to discuss alternative trxs.

## 2018-04-28 NOTE — Telephone Encounter (Signed)
Patient called back- she states the Relpax seemed to help- but it effected her body to the point that she had trouble moving her arms- they felt fatigued. She does not want to use this medication any more. Patient states Dr Clelia CroftShaw had mentioned Maxalt and a nasal spray ay the last visit and she is willing to try one of those. Her best contact is her cell number.

## 2018-04-28 NOTE — Telephone Encounter (Signed)
Please advise 

## 2018-04-28 NOTE — Telephone Encounter (Signed)
Left message for patient to call back- need to see what her response to medication was- if any- and what other medications she has tried in the past. Record indicates Imitrex want to know if anything else.

## 2018-04-29 NOTE — Telephone Encounter (Signed)
Left detailed VM advising pt.  

## 2018-05-04 ENCOUNTER — Other Ambulatory Visit: Payer: Self-pay | Admitting: Family Medicine

## 2018-05-04 NOTE — Telephone Encounter (Signed)
Copied from CRM [id number]. Topic: Quick Communication - Rx Refill/Question >> May 04, 2018 11:43 AM Stephannie LiSimmons, Xzaviar Maloof L, NT wrote: Medication:tizanidine ZANAFLEX 6 MG capsule  Has the patient contacted their pharmacy? yes  (Agent: If no, request that the patient contact the pharmacy for the refill. (Agent: If yes, when and what did the pharmacy advise  Preferred Pharmacy with phone number or street name: [facility] [phone][facility][id number] [place]- [place], [place] - [id number] [facility] [address] [facility] [address] [phone] Phone [phone] (Fax)  The pharmacy called to follow up concerning the above medication for a refill    Agent: Please be advised that RX refills may take up to 3 business days. We ask that you follow-up with your pharmacy.

## 2018-05-05 MED ORDER — TIZANIDINE HCL 6 MG PO CAPS: 6.0000 mg | ORAL_CAPSULE | Freq: Every day | ORAL | 0 refills | Status: DC

## 2018-05-05 NOTE — Telephone Encounter (Signed)
Rx refill request: Zanaflex 6 mg  - possible short term medication- no RF given.  LOV: 04/01/18  PCP: Clelia CroftShaw  Pharmacy: verified

## 2018-05-11 DIAGNOSIS — Z6837 Body mass index (BMI) 37.0-37.9, adult: Secondary | ICD-10-CM | POA: Diagnosis not present

## 2018-05-11 DIAGNOSIS — Z713 Dietary counseling and surveillance: Secondary | ICD-10-CM | POA: Diagnosis not present

## 2018-06-03 DIAGNOSIS — Z713 Dietary counseling and surveillance: Secondary | ICD-10-CM | POA: Diagnosis not present

## 2018-06-03 DIAGNOSIS — Z6837 Body mass index (BMI) 37.0-37.9, adult: Secondary | ICD-10-CM | POA: Diagnosis not present

## 2018-06-08 ENCOUNTER — Other Ambulatory Visit: Payer: Self-pay | Admitting: Family Medicine

## 2018-06-08 NOTE — Telephone Encounter (Signed)
Copied from CRM [id number] [id number]. Topic: Quick Communication - Rx Refill/Question >> Jun 08, 2018  1:52 PM Oneal GroutSebastian, Jennifer S wrote: Medication: tizanidine (ZANAFLEX) 6 MG capsule   Has the patient contacted their pharmacy? Yes.   (Agent: If no, request that the patient contact the pharmacy for the refill.) (Agent: If yes, when and what did the pharmacy advise?)  Preferred Pharmacy (with phone number or street name): [facility]  Agent: Please be advised that RX refills may take up to 3 business days. We ask that you follow-up with your pharmacy.

## 2018-06-09 NOTE — Telephone Encounter (Signed)
Refill of Zanaflex  LRF 05/05/18  #30  0 refills  LOV 12/09/17 Dr. Garnetta BuddyShaw  [facility]

## 2018-06-20 MED ORDER — TIZANIDINE HCL 6 MG PO CAPS: 6.0000 mg | ORAL_CAPSULE | Freq: Every day | ORAL | 0 refills | Status: DC

## 2018-06-30 DIAGNOSIS — Z6837 Body mass index (BMI) 37.0-37.9, adult: Secondary | ICD-10-CM | POA: Diagnosis not present

## 2018-06-30 DIAGNOSIS — Z713 Dietary counseling and surveillance: Secondary | ICD-10-CM | POA: Diagnosis not present

## 2018-07-05 ENCOUNTER — Telehealth: Payer: Self-pay | Admitting: Neurology

## 2018-07-05 ENCOUNTER — Ambulatory Visit: Payer: BLUE CROSS/BLUE SHIELD | Admitting: Neurology

## 2018-07-05 ENCOUNTER — Encounter: Payer: Self-pay | Admitting: Neurology

## 2018-07-05 VITALS — BP 150/86 | HR 85 | Ht 64.0 in | Wt 158.0 lb

## 2018-07-05 DIAGNOSIS — H539 Unspecified visual disturbance: Secondary | ICD-10-CM

## 2018-07-05 DIAGNOSIS — G43711 Chronic migraine without aura, intractable, with status migrainosus: Secondary | ICD-10-CM

## 2018-07-05 DIAGNOSIS — G8929 Other chronic pain: Secondary | ICD-10-CM

## 2018-07-05 DIAGNOSIS — R51 Headache with orthostatic component, not elsewhere classified: Secondary | ICD-10-CM

## 2018-07-05 DIAGNOSIS — R519 Headache, unspecified: Secondary | ICD-10-CM

## 2018-07-05 NOTE — Telephone Encounter (Signed)
[employer] order sent to GI. They obtain the auth and will reach out to the pt to schedule.  °

## 2018-07-05 NOTE — Telephone Encounter (Signed)
Patient was brought to me today as a new start we scheduled an apt for September 6th because I needed at least two weeks for the auth and Dr. Lucia GaskinsAhern is off the last week of August. I called [employer] and spoke with Nia who stated that all precerts would be handled through the pharmacy, their number is, [phone]. I was transferred to this line. [facility] who stated that they would not handle the benefits, [employer] rx would. She transferred me to them. I spoke with Leigh she stated that they do not handle the precert but rx benefits does and their website is [email or web address]. I went to this website and submitted the clinical request. They will fax a response.

## 2018-07-05 NOTE — Progress Notes (Signed)
[facility]    Provider:  Dr Erin Anderson Referring Provider: [name], Erin N, MD Primary Care Physician:  [name], Erin N, MD  CC:  Migraines  HPI:  Erin Anderson is a 57 y.o. female here as a referral from Dr. Clelia Anderson for migraines. Grandmother had significant headaches. Mother had headaches.  She has had headaches always even as a teenager. Worsening over the last several years. She has nausea, vomiting, +photo/phonophobia, severe light sensitivity, migraine hangover afterwards, unilateral more on the left, pulsating/pounding, behind the eye ,severe. More frequent on the left but can be on the right and more severe on the right and spreads to the head and neck and shoulders. 20 headache days a month, wakes with headaches often it will wake her up in the middle of the night, she snores, no excessive daytime fatigue. 10 are migrainous and can last 24-72 hours. Worse with allergy triggers.  No medication overuse. No aura. Migraines are always severe. Going on at this frequency and severity for over a year.  These are different than prior headaches and started after the age of 57. + vision changes. No other focal neurologic deficits, associated symptoms, inciting events or modifiable factors.  Meds tried: fioricet, relpax(side effects), zoloft, zanaflex, tramadol, flexeril, Topamax, propranolol  Reviewed notes, labs and imaging from outside physicians, which showed:  about 10 yrs ago she started having migraines. she always struggled with headaches but she noticied they became worse. she says that happens off and on and could be related to allergies. when she does have a migraine it can linger for a week. she notices weather and stress can play a role   CMP, TSH normal  Review of Systems: Patient complains of symptoms per HPI as well as the following symptoms: headache, . Pertinent negatives and positives per HPI. All others negative.   Social History   Socioeconomic History  . Marital  status: Divorced    Spouse name: Not on file  . Number of children: 3  . Years of education: 5713  . Highest education level: Some college, no degree  Occupational History  . Occupation: patient account rep    Employer: [employer]  Social Needs  . Financial resource strain: Not on file  . Food insecurity:    Worry: Not on file    Inability: Not on file  . Transportation needs:    Medical: Not on file    Non-medical: Not on file  Tobacco Use  . Smoking status: Former Smoker    Packs/day: 1.00    Years: 10.00    Pack years: 10.00    Types: Cigarettes    Last attempt to quit: 12/31/1985    Years since quitting: 32.5  . Smokeless tobacco: Never Used  Substance and Sexual Activity  . Alcohol use: Yes    Comment: 5-6 weekly  . Drug use: No  . Sexual activity: Yes    Partners: Male    Birth control/protection: None  Lifestyle  . Physical activity:    Days per week: Not on file    Minutes per session: Not on file  . Stress: Not on file  Relationships  . Social connections:    Talks on phone: Not on file    Gets together: Not on file    Attends religious service: Not on file    Active member of club or organization: Not on file    Attends meetings of clubs or organizations: Not on file    Relationship status:  Not on file  . Intimate partner violence:    Fear of current or ex partner: Not on file    Emotionally abused: Not on file    Physically abused: Not on file    Forced sexual activity: Not on file  Other Topics Concern  . Not on file  Social History Narrative   Divorced. 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 Exercises: 3 times a week, walk and yoga.    Family History  Problem Relation Age of Onset  . Alcohol abuse Father        died at age 58 from alcohol related issues  . Migraines Mother   . Alzheimer's disease Maternal Grandmother   . Brain cancer Maternal Grandfather   . Migraines Maternal Grandfather     Past Medical History:  Diagnosis Date  . Allergy     . Anxiety   . Depression   . GERD (gastroesophageal reflux disease)   . Herpes zoster   . Migraines   . Ovarian cyst    recurrent  . SUI (stress urinary incontinence, female) 04/01/2018  . Thyroid disease    cysts    Past Surgical History:  Procedure Laterality Date  . CESAREAN SECTION  1988  . ENDOMETRIAL ABLATION  2012  . TONSILLECTOMY AND ADENOIDECTOMY  1963    Current Outpatient Medications  Medication Sig Dispense Refill  . butalbital-acetaminophen-caffeine (FIORICET, ESGIC) 50-325-40 MG tablet take 1 to 2 tablets by mouth every 6 hours if needed for headache 60 tablet 2  . cetirizine (ZYRTEC) 10 MG tablet Take 1 tablet (10 mg total) by mouth at bedtime. 90 tablet 3  . diclofenac sodium (VOLTAREN) 1 % GEL Apply 4 g topically 4 (four) times daily. 100 g 11  . eletriptan (RELPAX) 40 MG tablet Take 1 tablet (40 mg total) by mouth as needed for migraine or headache. May repeat in 2 hours if headache persists or recurs. 10 tablet 2  . Estradiol-Norethindrone Acet (AMABELZ) 0.5-0.1 MG tablet Take 1 tablet by mouth daily.    . fluticasone (FLONASE) 50 MCG/ACT nasal spray INSTILL 2 SPRAYS INTO EACH NOSTRIL ONCE DAILY AT BEDTIME 16 g 2  . levothyroxine (SYNTHROID, LEVOTHROID) 88 MCG tablet TAKE 1 TABLET BY MOUTH EVERY MORNING BEFORE BREAKFAST ON AN EMPTY STOMACH 90 tablet 2  . LORazepam (ATIVAN) 0.5 MG tablet Take 1 tablet (0.5 mg total) by mouth every 8 (eight) hours as needed for anxiety. 60 tablet 1  . PRESCRIPTION MEDICATION Hormone Replacement therapy taking everyday    . RA ALLERGY RELIEF 10 MG tablet take 1 tablet by mouth once daily NO MORE REFILLS WITHOUT OFFICE VISIT 90 tablet 2  . sertraline (ZOLOFT) 100 MG tablet Take 1 tablet (100 mg total) by mouth daily. 90 tablet 3  . tizanidine (ZANAFLEX) 6 MG capsule Take 1 capsule (6 mg total) by mouth at bedtime. 30 capsule 0  . traMADol (ULTRAM) 50 MG tablet take 1 tablet by mouth every 8 hours if needed 60 tablet 2   No current  facility-administered medications for this visit.     Allergies as of 07/05/2018 - Review Complete 07/05/2018  Allergen Reaction Noted  . Sulfa antibiotics  12/30/2011  . Sulfonamide derivatives    . Meloxicam Rash 05/05/2013    Vitals: BP (!) 150/86   Pulse 85   Ht 5\' 4"  (1.626 m)   Wt 158 lb (71.7 kg)   BMI 27.12 kg/m  Last Weight:  Wt Readings from Last 1 Encounters:  07/05/18 158 lb (71.7 kg)  Last Height:   Ht Readings from Last 1 Encounters:  07/05/18 5\' 4"  (1.626 m)   Physical exam: Exam: Gen: NAD, conversant, well nourised, well groomed                     CV: RRR, no MRG. No Carotid Bruits. No peripheral edema, warm, nontender Eyes: Conjunctivae clear without exudates or hemorrhage  Neuro: Detailed Neurologic Exam  Speech:    Speech is normal; fluent and spontaneous with normal comprehension.  Cognition:    The patient is oriented to person, place, and time;     recent and remote memory intact;     language fluent;     normal attention, concentration,     fund of knowledge Cranial Nerves:    The pupils are equal, round, and reactive to light. The fundi are normal and spontaneous venous pulsations are present. Visual fields are full to finger confrontation. Extraocular movements are intact. Trigeminal sensation is intact and the muscles of mastication are normal. The face is symmetric. The palate elevates in the midline. Hearing intact. Voice is normal. Shoulder shrug is normal. The tongue has normal motion without fasciculations.   Coordination:    Normal finger to nose and heel to shin. Normal rapid alternating movements.   Gait:    Heel-toe and tandem gait are normal.   Motor Observation:    No asymmetry, no atrophy, and no involuntary movements noted. Tone:    Normal muscle tone.    Posture:    Posture is normal. normal erect    Strength:    Strength is V/V in the upper and lower limbs.      Sensation: intact to LT     Reflex  Exam:  DTR's:    Deep tendon reflexes in the upper and lower extremities are normal bilaterally.   Toes:    The toes are downgoing bilaterally.   Clonus:    Clonus is absent.       Assessment/Plan:  57 year old with chronic migraines  Failed multiple classes of medications Botox for migraine: if declined, prescribe propranol cgrp ** discuss in future  MRI brain due to concerning symptoms of morning headaches, positional headaches,vision changes  to look for space occupying mass, chiari or intracranial hypertension (pseudotumor).  Orders Placed This Encounter  Procedures  . MR BRAIN W WO CONTRAST    Discussed: To prevent or relieve headaches, try the following: Cool Compress. Lie down and place a cool compress on your head.  Avoid headache triggers. If certain foods or odors seem to have triggered your migraines in the past, avoid them. A headache diary might help you identify triggers.  Include physical activity in your daily routine. Try a daily walk or other moderate aerobic exercise.  Manage stress. Find healthy ways to cope with the stressors, such as delegating tasks on your to-do list.  Practice relaxation techniques. Try deep breathing, yoga, massage and visualization.  Eat regularly. Eating regularly scheduled meals and maintaining a healthy diet might help prevent headaches. Also, drink plenty of fluids.  Follow a regular sleep schedule. Sleep deprivation might contribute to headaches Consider biofeedback. With this mind-body technique, you learn to control certain bodily functions - such as muscle tension, heart rate and blood pressure - to prevent headaches or reduce headache pain.    Proceed to emergency room if you experience new or worsening symptoms or symptoms do not resolve, if you have new neurologic symptoms or if headache is severe, or  for any concerning symptom.   Provided education and documentation from American headache Society toolbox including articles  on: chronic migraine medication overuse headache, chronic migraines, prevention of migraines, behavioral and other nonpharmacologic treatments for headache.   Naomie Dean, MD  [facility] [address] [place], [place] [address]  Phone [phone] Fax [phone]

## 2018-07-06 ENCOUNTER — Encounter: Payer: Self-pay | Admitting: Neurology

## 2018-07-06 DIAGNOSIS — G43711 Chronic migraine without aura, intractable, with status migrainosus: Secondary | ICD-10-CM | POA: Insufficient documentation

## 2018-07-07 NOTE — Telephone Encounter (Signed)
Received a fax from the patient insurance requesting clinical documentation. I have faxed over these clinicals.

## 2018-07-08 ENCOUNTER — Other Ambulatory Visit: Payer: Self-pay | Admitting: Family Medicine

## 2018-07-08 NOTE — Telephone Encounter (Signed)
fluticasone refill Last Refill:06/08/18 #16 grams Last OV: 02/19/17 PCP: Dr Norberto SorensonEva Shaw [facility] [address] [place], [place]

## 2018-07-19 NOTE — Telephone Encounter (Signed)
I received an approval fax from rx benefits [employer] number [id number] and it is valid through (10/05/18). DW

## 2018-07-19 NOTE — Telephone Encounter (Signed)
I called the pharmacy to check status and they needed me to speak with the pharmacist. They took my phone number to give me a call back with questions.

## 2018-07-21 NOTE — Telephone Encounter (Signed)
I called the pharmacy to check status but their system was down. Will call back later.

## 2018-07-22 NOTE — Telephone Encounter (Signed)
I called [facility] who had no record of the script or an account. I relayed that I had spoken to them previously but they had no record of this. I called the patients policy directly that stated I would need to use [facility] instead. I called [facility] who stated they will not fill Botox. I called the Rx Benefits line and asked if it was eligible for B/B and they stated that it had to go through [facility]. I informed the representative that they would not fill it. She reached out to a team lead. I spoke with [name] who stated that they could not tell me anything about the coverage of the PA and I would have to call back to the plan. I called back to plan and spoke with Alli for stated it was eligible for B/B and [id number] does not require an authorization. Reference# for this call was [id number].

## 2018-07-28 ENCOUNTER — Other Ambulatory Visit: Payer: Self-pay | Admitting: Family Medicine

## 2018-07-28 DIAGNOSIS — Z713 Dietary counseling and surveillance: Secondary | ICD-10-CM | POA: Diagnosis not present

## 2018-07-28 DIAGNOSIS — Z6837 Body mass index (BMI) 37.0-37.9, adult: Secondary | ICD-10-CM | POA: Diagnosis not present

## 2018-07-28 NOTE — Telephone Encounter (Signed)
Lorazepam 0.5 mg refill Last Refill:02/21/18 # 60 Last OV: 12/09/17 PCP: Erin Anderson Pharmacy:[facility] [address]

## 2018-07-30 ENCOUNTER — Telehealth: Payer: Self-pay | Admitting: Neurology

## 2018-07-30 ENCOUNTER — Other Ambulatory Visit: Payer: Self-pay | Admitting: Neurology

## 2018-07-30 ENCOUNTER — Ambulatory Visit: Payer: 59 | Admitting: Neurology

## 2018-07-30 DIAGNOSIS — G43711 Chronic migraine without aura, intractable, with status migrainosus: Secondary | ICD-10-CM | POA: Diagnosis not present

## 2018-07-30 MED ORDER — ERENUMAB-AOOE 140 MG/ML ~~LOC~~ SOAJ: 140.0000 mg | SUBCUTANEOUS | 11 refills | Status: DC

## 2018-07-30 MED ORDER — ALPRAZOLAM 0.5 MG PO TABS: ORAL_TABLET | ORAL | 0 refills | Status: DC

## 2018-07-30 NOTE — Addendum Note (Signed)
Addended by: Naomie Dean B on: 07/30/2018 10:47 AM   Modules accepted: Orders

## 2018-07-30 NOTE — Progress Notes (Signed)
Botox-100unitsx2 vials Lot: [id number] Expiration: 01/2021 NDC: 0023-1145-01 [id number]  0.9% Sodium Chloride- 4mL total Lot: [id number] Expiration: 08/2019 NDC: 0409-4888-03  Dx: G43.711 B/B  

## 2018-07-30 NOTE — Telephone Encounter (Signed)
Called the patient and made her aware that we will call this medication into the pharmacy on file for her. Informed her to take the tablet 30-60 min prior to the test. If she needs the other tablet she may take that at the time of the procedure. Instructed the patient that this medication can cause drowsiness and would like to make sure she has a driver.Pt verbalized understanding.

## 2018-07-30 NOTE — Progress Notes (Addendum)
Consent Form Botulism Toxin Injection For Chronic Migraine  +masseters and May temples. Not eyes. Not LS. May consider in future.  Reviewed orally with patient, additionally signature is on file:  Botulism toxin has been approved by the Federal drug administration for treatment of chronic migraine. Botulism toxin does not cure chronic migraine and it may not be effective in some patients.  The administration of botulism toxin is accomplished by injecting a small amount of toxin into the muscles of the neck and head. Dosage must be titrated for each individual. Any benefits resulting from botulism toxin tend to wear off after 3 months with a repeat injection required if benefit is to be maintained. Injections are usually done every 3-4 months with maximum effect peak achieved by about 2 or 3 weeks. Botulism toxin is expensive and you should be sure of what costs you will incur resulting from the injection.  The side effects of botulism toxin use for chronic migraine may include:   -Transient, and usually mild, facial weakness with facial injections  -Transient, and usually mild, head or neck weakness with head/neck injections  -Reduction or loss of forehead facial animation due to forehead muscle weakness  -Eyelid drooping  -Dry eye  -Pain at the site of injection or bruising at the site of injection  -Double vision  -Potential unknown long term risks  Contraindications: You should not have Botox if you are pregnant, nursing, allergic to albumin, have an infection, skin condition, or muscle weakness at the site of the injection, or have myasthenia gravis, Lambert-Eaton syndrome, or ALS.  It is also possible that as with any injection, there may be an allergic reaction or no effect from the medication. Reduced effectiveness after repeated injections is sometimes seen and rarely infection at the injection site may occur. All care will be taken to prevent these side effects. If therapy is  given over a long time, atrophy and wasting in the muscle injected may occur. Occasionally the patient's become refractory to treatment because they develop antibodies to the toxin. In this event, therapy needs to be modified.  I have read the above information and consent to the administration of botulism toxin.    BOTOX PROCEDURE NOTE FOR MIGRAINE HEADACHE    Contraindications and precautions discussed with patient(above). Aseptic procedure was observed and patient tolerated procedure. Procedure performed by Dr. Artemio Aly  The condition has existed for more than 6 months, and pt does not have a diagnosis of ALS, Myasthenia Gravis or Lambert-Eaton Syndrome.  Risks and benefits of injections discussed and pt agrees to proceed with the procedure.  Written consent obtained  These injections are medically necessary. Pt  receives good benefits from these injections. These injections do not cause sedations or hallucinations which the oral therapies may cause.  Indication/Diagnosis: chronic migraine BOTOX(J0585) injection was performed according to protocol by [employer]. 200 units of BOTOX was dissolved into 4 cc NS.   NDC: [address]-78   Description of procedure:  The patient was placed in a sitting position. The standard protocol was used for Botox as follows, with 5 units of Botox injected at each site:   -Procerus muscle, midline injection  -Corrugator muscle, bilateral injection  -Frontalis muscle, bilateral injection, with 2 sites each side, medial injection was performed in the upper one third of the frontalis muscle, in the region vertical from the medial inferior edge of the superior orbital rim. The lateral injection was again in the upper one third of the forehead vertically above the  lateral limbus of the cornea, 1.5 cm lateral to the medial injection site.  -Temporalis muscle injection, 4 sites, bilaterally. The first injection was 3 cm above the tragus of the ear, second  injection site was 1.5 cm to 3 cm up from the first injection site in line with the tragus of the ear. The third injection site was 1.5-3 cm forward between the first 2 injection sites. The fourth injection site was 1.5 cm posterior to the second injection site.  -Occipitalis muscle injection, 3 sites, bilaterally. The first injection was done one half way between the occipital protuberance and the tip of the mastoid process behind the ear. The second injection site was done lateral and superior to the first, 1 fingerbreadth from the first injection. The third injection site was 1 fingerbreadth superiorly and medially from the first injection site.  -Cervical paraspinal muscle injection, 2 sites, bilateral knee first injection site was 1 cm from the midline of the cervical spine, 3 cm inferior to the lower border of the occipital protuberance. The second injection site was 1.5 cm superiorly and laterally to the first injection site.  -Trapezius muscle injection was performed at 3 sites, bilaterally. The first injection site was in the upper trapezius muscle halfway between the inflection point of the neck, and the acromion. The second injection site was one half way between the acromion and the first injection site. The third injection was done between the first injection site and the inflection point of the neck.   Will return for repeat injection in 3 months.   A 200 unit sof Botox was used, 155 units were injected, the rest of the Botox was wasted. The patient tolerated the procedure well, there were no complications of the above procedure.

## 2018-07-30 NOTE — Telephone Encounter (Signed)
Patient called to state she has an MRI Monday and was supposed to be getting an rx for xanax. She would like a status update on this. Please call on the work number.

## 2018-08-01 ENCOUNTER — Other Ambulatory Visit: Payer: Self-pay | Admitting: Family Medicine

## 2018-08-02 ENCOUNTER — Ambulatory Visit
Admission: RE | Admit: 2018-08-02 | Discharge: 2018-08-02 | Disposition: A | Discharge status: Home / Self Care | Payer: BLUE CROSS/BLUE SHIELD | Source: Ambulatory Visit | Attending: Neurology | Admitting: Neurology

## 2018-08-02 DIAGNOSIS — R51 Headache with orthostatic component, not elsewhere classified: Secondary | ICD-10-CM

## 2018-08-02 DIAGNOSIS — H539 Unspecified visual disturbance: Secondary | ICD-10-CM

## 2018-08-02 DIAGNOSIS — G8929 Other chronic pain: Secondary | ICD-10-CM

## 2018-08-02 DIAGNOSIS — R519 Headache, unspecified: Secondary | ICD-10-CM

## 2018-08-02 MED ORDER — GADOBENATE DIMEGLUMINE 529 MG/ML IV SOLN
13.0000 mL | Freq: Once | INTRAVENOUS | Status: AC | PRN
  Administered 2018-08-02: 13 mL via INTRAVENOUS

## 2018-08-02 NOTE — Telephone Encounter (Signed)
Lorazepam refill Last Refill:09/25/17 #60 with 1 refill Last OV: 12/09/17 PCP: Dr. Clelia Croft

## 2018-08-07 NOTE — Telephone Encounter (Signed)
Refilled lorazepam #60 no refills. Pt needs to schedule annual CPE for 3-4 mos from now - will be due for OV in Jan or prior.  Today I have utilized the [place] Controlled Substance Registry's online query to confirm compliance regarding the patient's controlled medications. My review reveals appropriate prescription fills and that lorazepam 0.5mg  #60 written by me on 11/2 was filled 11/2 and 3/31. Rechecks will occur regularly and the patient is aware of our use of the system.  Pt did have just 4 of alprazolam 0.5mg  tabs from Dr. Lucia GaskinsAhern filled to take prior to her botox procedure

## 2018-08-07 NOTE — Telephone Encounter (Signed)
Already addressed - sent in refill earlier today.

## 2018-08-10 ENCOUNTER — Other Ambulatory Visit: Payer: Self-pay | Admitting: Family Medicine

## 2018-08-11 ENCOUNTER — Telehealth: Payer: Self-pay | Admitting: Neurology

## 2018-08-11 NOTE — Telephone Encounter (Signed)
Pt is asking to be called re: her feeling there is an area on her forehead she doesn't feel the Botox took. Pt states this feeling is around her right eye.  Please call

## 2018-08-11 NOTE — Telephone Encounter (Addendum)
Called pt and LVM asking for call back. She returned my call right after. Pt stated that one spot to the right of her right eye is like it didn't take, it goes up but only partially, pulls weird. She thinks it looks odd and cockeyed. She denies any droopy eye. She asked if Dr. Lucia GaskinsAhern could touch this up. RN advised that she will speak with Dr. Lucia GaskinsAhern and return her call hopefully today possibly tomorrow. Pt appreciative.

## 2018-08-11 NOTE — Telephone Encounter (Signed)
That's fine have her come in at lunch tomorrow

## 2018-08-11 NOTE — Telephone Encounter (Signed)
Attempted to call patient to schedule her for the touchup. LVM and advised her that I would try again at 5:15 otherwise pt can call first thing tomorrow to schedule. Left office number in message.

## 2018-08-11 NOTE — Telephone Encounter (Signed)
Spoke with pt and scheduled her for the addition of Botox in affected area on 08/12/18 @ 09:30. Pt verbalized appreciation. Dr. Lucia GaskinsAhern updated.    no charge visit, sample will be used for this

## 2018-08-12 ENCOUNTER — Ambulatory Visit (INDEPENDENT_AMBULATORY_CARE_PROVIDER_SITE_OTHER): Payer: 59 | Admitting: Neurology

## 2018-08-12 DIAGNOSIS — Q670 Congenital facial asymmetry: Secondary | ICD-10-CM

## 2018-08-12 NOTE — Progress Notes (Signed)
Botox- 100 units x 1 vial Lot: [id number] Expiration: 01/2021 NDC: [id number]  Bacteriostatic 0.9% Sodium Chloride- 2 mL total Lot: [id number] Expiration: 08/25/2019 NDC: [id number]  Sample used

## 2018-08-12 NOTE — Progress Notes (Signed)
Patient with assymettry of right brow. Injected 2 units above the right eyebrow.

## 2018-08-20 ENCOUNTER — Ambulatory Visit: Payer: 59 | Admitting: Family Medicine

## 2018-08-24 DIAGNOSIS — Z713 Dietary counseling and surveillance: Secondary | ICD-10-CM | POA: Diagnosis not present

## 2018-08-24 DIAGNOSIS — Z6837 Body mass index (BMI) 37.0-37.9, adult: Secondary | ICD-10-CM | POA: Diagnosis not present

## 2018-08-31 DIAGNOSIS — H1031 Unspecified acute conjunctivitis, right eye: Secondary | ICD-10-CM | POA: Diagnosis not present

## 2018-09-30 DIAGNOSIS — Z713 Dietary counseling and surveillance: Secondary | ICD-10-CM | POA: Diagnosis not present

## 2018-09-30 DIAGNOSIS — Z6837 Body mass index (BMI) 37.0-37.9, adult: Secondary | ICD-10-CM | POA: Diagnosis not present

## 2018-10-27 DIAGNOSIS — Z713 Dietary counseling and surveillance: Secondary | ICD-10-CM | POA: Diagnosis not present

## 2018-10-27 DIAGNOSIS — Z6837 Body mass index (BMI) 37.0-37.9, adult: Secondary | ICD-10-CM | POA: Diagnosis not present

## 2018-10-29 ENCOUNTER — Ambulatory Visit: Payer: 59 | Admitting: Neurology

## 2018-12-01 DIAGNOSIS — Z713 Dietary counseling and surveillance: Secondary | ICD-10-CM | POA: Diagnosis not present

## 2018-12-01 DIAGNOSIS — Z6837 Body mass index (BMI) 37.0-37.9, adult: Secondary | ICD-10-CM | POA: Diagnosis not present

## 2018-12-08 ENCOUNTER — Telehealth: Payer: Self-pay | Admitting: Family Medicine

## 2018-12-08 NOTE — Telephone Encounter (Signed)
Requested medication (s) are due for refill today: Yes  Requested medication (s) are on the active medication list: Yes  Last refill:  12/09/17  Future visit scheduled: No  Notes to clinic:  See request    Requested Prescriptions  Pending Prescriptions Disp Refills   butalbital-acetaminophen-caffeine (FIORICET, ESGIC) 50-325-40 MG tablet [Pharmacy Med Name: BUTALBITAL/ACETAMINOPHEN/CAFF TABS] 60 tablet     Sig: TAKE 1-2 TABLETS BY MOUTH EVERY 6 HOURS IF NEEDED FOR HEADACHE     Not Delegated - Analgesics:  Non-Opioid Analgesic Combinations Failed - 12/08/2018 10:52 AM      Failed - This refill cannot be delegated      Failed - Valid encounter within last 12 months    Recent Outpatient Visits          12 months ago Hypothyroidism due to acquired atrophy of thyroid   [facility] Levell July, [facility]   1 year ago Hypothyroidism due to acquired atrophy of thyroid   [facility] Levell July, MD   2 years ago Hypothyroidism due to acquired atrophy of thyroid   [facility] Levell July, MD   3 years ago Sinus headache   [facility] Levell July, MD   4 years ago Neck strain, initial encounter   [facility] Levell July, MD

## 2018-12-10 NOTE — Telephone Encounter (Signed)
Please Advise

## 2018-12-12 ENCOUNTER — Other Ambulatory Visit: Payer: Self-pay | Admitting: Family Medicine

## 2018-12-13 NOTE — Telephone Encounter (Signed)
Requested medication (s) are due for refill today: Yes  Requested medication (s) are on the active medication list: Yes  Last refill:  12/09/17 and 03/07/18  Future visit scheduled: No  Notes to clinic:  Unable to refill per protocol, needs appointment (physical).     Requested Prescriptions  Pending Prescriptions Disp Refills   sertraline (ZOLOFT) 100 MG tablet [Pharmacy Med Name: SERTRALINE 100MG  TABLETS] 90 tablet 3    Sig: TAKE 1 TABLET BY MOUTH ONCE DAILY     Psychiatry:  Antidepressants - SSRI Failed - 12/13/2018 11:26 AM      Failed - Valid encounter within last 6 months    Recent Outpatient Visits          1 year ago Hypothyroidism due to acquired atrophy of thyroid   [facility] Levell July, MD   1 year ago Hypothyroidism due to acquired atrophy of thyroid   [facility] Levell July, MD   2 years ago Hypothyroidism due to acquired atrophy of thyroid   [facility] Levell July, MD   3 years ago Sinus headache   [facility] Levell July, MD   4 years ago Neck strain, initial encounter   [facility] Levell July, MD            levothyroxine (SYNTHROID, LEVOTHROID) 88 MCG tablet [Pharmacy Med Name: LEVOTHYROXINE 0.088MG  ( ) TAB] 90 tablet 2    Sig: TAKE 1 TABLET BY MOUTH EVERY MORNING BEFORE BREAKFAST ON AN EMPTY STOMACH     Endocrinology:  Hypothyroid Agents Failed - 12/13/2018 11:26 AM      Failed - TSH needs to be rechecked within 3 months after an abnormal result. Refill until TSH is due.      Failed - TSH in normal range and within 360 days    TSH  Date Value Ref Range Status  12/09/2017 4.160 0.450 - 4.500 uIU/mL Final         Failed - Valid encounter within last 12 months    Recent Outpatient Visits          1 year ago Hypothyroidism due to acquired atrophy of thyroid   [facility] Levell July, MD   1 year ago Hypothyroidism due to acquired atrophy of thyroid   [facility] Levell July, MD   2 years ago Hypothyroidism due to acquired atrophy of thyroid   [facility] Levell July, MD   3 years ago Sinus headache   [facility] Levell July, [facility]   4 years ago Neck strain, initial encounter   [facility] Levell July, MD

## 2018-12-13 NOTE — Telephone Encounter (Signed)
Patient called, left VM to return call to the office to schedule a physical with Dr. Clelia Croft in order to receive refills.

## 2018-12-13 NOTE — Telephone Encounter (Signed)
Last OV with Dr Clelia Croft, her PCP, was in may 2019 Last OV with neuro was in sept 2019 for management of mirgraines PMP reviewed and this medication does not appear as prescribed in past year Medication denied, she should discuss with her neurologist.  thanks

## 2018-12-14 NOTE — Telephone Encounter (Signed)
Patient says she needs her migraine medication now, and the next appointment is not until 01/06/2019 w/ Dr. Clelia Croft. Would like a call back to dsicuss asap.

## 2018-12-14 NOTE — Telephone Encounter (Signed)
I talked with pt. Pt states that she will be leaving on Friday and would like the migraine medication filled before she leave( butalbital-acetaminophen-caffein)  I talked with Dr. Alvy BimlerSagardia and he states that it's ok to put her on his same day schedule tomorrow 12/15/2018  Please call pt and schedule appointment

## 2018-12-15 ENCOUNTER — Ambulatory Visit: Payer: 59 | Admitting: Emergency Medicine

## 2018-12-15 ENCOUNTER — Telehealth: Payer: Self-pay | Admitting: Family Medicine

## 2018-12-18 MED ORDER — TRAMADOL HCL 50 MG PO TABS: ORAL_TABLET | ORAL | 0 refills | Status: AC

## 2018-12-18 MED ORDER — LEVOTHYROXINE SODIUM 88 MCG PO TABS: 88.0000 ug | ORAL_TABLET | Freq: Every day | ORAL | 1 refills | Status: AC

## 2018-12-18 MED ORDER — SERTRALINE HCL 100 MG PO TABS: 100.0000 mg | ORAL_TABLET | Freq: Every day | ORAL | 1 refills | Status: DC

## 2018-12-18 MED ORDER — DICLOFENAC SODIUM 1 % TD GEL: 4.0000 g | Freq: Four times a day (QID) | TRANSDERMAL | 2 refills | Status: DC

## 2018-12-18 MED ORDER — CETIRIZINE HCL 10 MG PO TABS: 10.0000 mg | ORAL_TABLET | Freq: Every day | ORAL | 1 refills | Status: AC

## 2018-12-18 MED ORDER — ELETRIPTAN HYDROBROMIDE 40 MG PO TABS: 40.0000 mg | ORAL_TABLET | ORAL | 2 refills | Status: DC | PRN

## 2018-12-18 MED ORDER — BUTALBITAL-APAP-CAFFEINE 50-325-40 MG PO TABS: ORAL_TABLET | ORAL | 0 refills | Status: DC

## 2018-12-18 MED ORDER — TIZANIDINE HCL 6 MG PO CAPS: 6.0000 mg | ORAL_CAPSULE | Freq: Every day | ORAL | 0 refills | Status: AC

## 2018-12-18 MED ORDER — LORAZEPAM 0.5 MG PO TABS: ORAL_TABLET | ORAL | 0 refills | Status: AC

## 2018-12-18 NOTE — Telephone Encounter (Signed)
Pt contacted me directly requesting refills on her chronic medications which she is completely out of.  Declined to see one of my partners for temporary refill. Explained to pt that unfortunately, she will need to establish with new PCP due to my resignation and my current emergent medical leave so will likely only be avail back in the office for 6 wks - right before to right after March - has been 1 yr since last TSH check and last OV.  Informed ill send in 3-6 mos of refill on all current chronic meds (all rxs are expired) to provide coverage during time it takes her to transition care to new PCP.  Meds ordered this encounter  Medications  . butalbital-acetaminophen-caffeine (FIORICET, ESGIC) 50-325-40 MG tablet    Sig: take 1 to 2 tablets by mouth every 6 hours if needed for headache    Dispense:  60 tablet    Refill:  0  . cetirizine (ZYRTEC) 10 MG tablet    Sig: Take 1 tablet (10 mg total) by mouth at bedtime.    Dispense:  90 tablet    Refill:  1    Pt does not need currently. Please add this on as refills to current rx.  . diclofenac sodium (VOLTAREN) 1 % GEL    Sig: Apply 4 g topically 4 (four) times daily.    Dispense:  100 g    Refill:  2  . eletriptan (RELPAX) 40 MG tablet    Sig: Take 1 tablet (40 mg total) by mouth as needed for migraine or headache. May repeat in 2 hours if headache persists or recurs.    Dispense:  10 tablet    Refill:  2  . levothyroxine (SYNTHROID, LEVOTHROID) 88 MCG tablet    Sig: Take 1 tablet (88 mcg total) by mouth daily before breakfast.    Dispense:  90 tablet    Refill:  1  . LORazepam (ATIVAN) 0.5 MG tablet    Sig: TAKE 1 TABLET BY MOUTH EVERY 8 HOURS IF NEEDED FOR ANXIETY    Dispense:  60 tablet    Refill:  0  . sertraline (ZOLOFT) 100 MG tablet    Sig: Take 1 tablet (100 mg total) by mouth daily.    Dispense:  90 tablet    Refill:  1  . tizanidine (ZANAFLEX) 6 MG capsule    Sig: Take 1 capsule (6 mg total) by mouth at bedtime.   Dispense:  30 capsule    Refill:  0  . traMADol (ULTRAM) 50 MG tablet    Sig: take 1 tablet by mouth every 8 hours if needed    Dispense:  60 tablet    Refill:  0

## 2018-12-22 ENCOUNTER — Telehealth: Payer: Self-pay | Admitting: Neurology

## 2018-12-22 NOTE — Telephone Encounter (Signed)
Erin HoyleBethany, Erin Anderson [place]. Looks like we used samples on her the last time but she does not meet the [place], she will need to proceed with a different course of treatment. Please advise.

## 2018-12-22 NOTE — Telephone Encounter (Signed)
Pt states she is wanting to know what the update is on her next BOTOX appt since she had to cancel her last appt due to ins issues. Please advise.

## 2018-12-22 NOTE — Telephone Encounter (Signed)
I spoke with the patient. She stated she thought she was doing well on the Aimovig (has had 5 doses) however now she is not sure as the last 2 weeks have been bad. She is aware that she didn't meet requirements through [facility] for botox however she is going to call the insurance company herself to follow-up and discuss. She will call our office back with an update. She verbalized appreciation for the call.

## 2018-12-22 NOTE — Telephone Encounter (Signed)
If she is doing well on the aimovig continue with that thanks

## 2018-12-23 NOTE — Telephone Encounter (Signed)
I called and spoke with nurse Gae Bon. She states that she failed the request due to previously tried and failed meds. She says they reviewed the clinicals and did not find 3 drug classes that met their approval. We reviewed them again because there were 3 of the requested drug classes listed in the office visit notes. The pharmacist that reviewed the case did not realize the patient had been on zoloft as well. She has suggested a [employer]. This has been submitted and a doctor is going to call and speak with Dr. Jaynee Eagles this afternoon or tomorrow for the P2P.

## 2018-12-24 NOTE — Telephone Encounter (Signed)
No one has called, is there a number that I can call? thanks

## 2018-12-24 NOTE — Telephone Encounter (Signed)
Member services line is [phone], that is the line on the denial form they are requesting we use.

## 2018-12-28 NOTE — Telephone Encounter (Signed)
Received a denial letter. This patient Botox has been denied.

## 2018-12-29 DIAGNOSIS — Z6837 Body mass index (BMI) 37.0-37.9, adult: Secondary | ICD-10-CM | POA: Diagnosis not present

## 2018-12-29 DIAGNOSIS — Z713 Dietary counseling and surveillance: Secondary | ICD-10-CM | POA: Diagnosis not present

## 2018-12-29 NOTE — Telephone Encounter (Signed)
They were supposed to call me back and never did. I would like to offer her samples and we can try again in 3 months. Let me know thanks

## 2018-12-30 NOTE — Telephone Encounter (Signed)
[name], we cannot resubmit in 3 months. If we wait that long the option to appeal or provider courtesy review will also be unavailable please call the number listed below [phone] and schedule P2P.

## 2019-01-03 NOTE — Telephone Encounter (Signed)
Danielle, I have called multiple times. Can you help me out and set up a P2P for me? I can take the call anytime. 4pm is perfect any day but I can slip out anytime. I am old [employer] and I got through once and they did not call. Can you help pelase??

## 2019-01-03 NOTE — Telephone Encounter (Signed)
Tried to call to schedule P2P @ [phone]. Unable to get through. Will try again.

## 2019-01-04 ENCOUNTER — Telehealth: Payer: Self-pay | Admitting: Family Medicine

## 2019-01-04 NOTE — Telephone Encounter (Signed)
LVM for pt regarding teir cancellation of the appt scheduled for 2/17 with Dr. Shaw. Due to Dr. Shaw being on leave, pt will need to be rescheduled. Pt can be schedule with Dr. Santiago if pt needs to be seen before Dr. Shaw returns or if pt can wait until Shaw comes back, please schedule with her when she returns.  °

## 2019-01-04 NOTE — Telephone Encounter (Signed)
Never received a call. There is no record of a call in epic. That stinks

## 2019-01-04 NOTE — Telephone Encounter (Signed)
I called [phone] to schedule the P2P. I spoke with Vicente Serene in the pharmacy authorization department. The options to do a P2P has passed. I informed them that we never got a call. They stated that records indicate they called [place] twice. I asked for the reviewer to call back again, he sent a request and said someone will call back.

## 2019-01-10 ENCOUNTER — Ambulatory Visit: Payer: 59 | Admitting: Family Medicine

## 2019-01-17 ENCOUNTER — Other Ambulatory Visit: Payer: Self-pay | Admitting: Family Medicine

## 2019-01-17 DIAGNOSIS — Z01419 Encounter for gynecological examination (general) (routine) without abnormal findings: Secondary | ICD-10-CM | POA: Diagnosis not present

## 2019-01-17 DIAGNOSIS — Z1231 Encounter for screening mammogram for malignant neoplasm of breast: Secondary | ICD-10-CM | POA: Diagnosis not present

## 2019-01-17 DIAGNOSIS — Z130 Encounter for screening for diseases of the blood and blood-forming organs and certain disorders involving the immune mechanism: Secondary | ICD-10-CM | POA: Diagnosis not present

## 2019-01-17 DIAGNOSIS — Z1389 Encounter for screening for other disorder: Secondary | ICD-10-CM | POA: Diagnosis not present

## 2019-01-17 DIAGNOSIS — N951 Menopausal and female climacteric states: Secondary | ICD-10-CM | POA: Diagnosis not present

## 2019-01-17 DIAGNOSIS — Z6825 Body mass index (BMI) 25.0-25.9, adult: Secondary | ICD-10-CM | POA: Diagnosis not present

## 2019-01-17 NOTE — Telephone Encounter (Signed)
Requested medication (s) are due for refill today -yes  Requested medication (s) are on the active medication list -yes  Future visit scheduled -no  Last refill: 12/18/18  Notes to clinic: Patient is requesting a non delegated Rx - sent for PCP review   Requested Prescriptions  Pending Prescriptions Disp Refills   tizanidine (ZANAFLEX) 6 MG capsule [Pharmacy Med Name: TIZANIDINE 6MG  CAPSULES] 30 capsule 0    Sig: TAKE 1 CAPSULE(6 MG) BY MOUTH AT BEDTIME     Not Delegated - Cardiovascular:  Alpha-2 Agonists - tizanidine Failed - 01/17/2019  3:33 AM      Failed - This refill cannot be delegated      Failed - Valid encounter within last 6 months    Recent Outpatient Visits          1 year ago Hypothyroidism due to acquired atrophy of thyroid   [facility] Levell July, MD   1 year ago Hypothyroidism due to acquired atrophy of thyroid   [facility] Levell July, MD   2 years ago Hypothyroidism due to acquired atrophy of thyroid   [facility] Levell July, MD   3 years ago Sinus headache   [facility] Levell July, MD   4 years ago Neck strain, initial encounter   [facility] Levell July, [facility]              Requested Prescriptions  Pending Prescriptions Disp Refills   tizanidine (ZANAFLEX) 6 MG capsule [Pharmacy Med Name: TIZANIDINE 6MG  CAPSULES] 30 capsule 0    Sig: TAKE 1 CAPSULE(6 MG) BY MOUTH AT BEDTIME     Not Delegated - Cardiovascular:  Alpha-2 Agonists - tizanidine Failed - 01/17/2019  3:33 AM      Failed - This refill cannot be delegated      Failed - Valid encounter within last 6 months    Recent Outpatient Visits          1 year ago Hypothyroidism due to acquired atrophy of thyroid   [facility] Levell July, MD   1 year ago Hypothyroidism due to acquired atrophy of thyroid   [facility] Levell July, MD   2 years ago Hypothyroidism due to acquired atrophy of thyroid   [facility] Levell July, [facility]   3 years ago Sinus headache   [facility] July, MD   4 years ago Neck strain, initial encounter   [facility] July, [facility]

## 2019-01-25 ENCOUNTER — Other Ambulatory Visit: Payer: Self-pay | Admitting: Family Medicine

## 2019-01-25 NOTE — Telephone Encounter (Signed)
Requested medication (s) are due for refill today: yes  Requested medication (s) are on the active medication list: yes  Last refill:  06/08/18  Future visit scheduled: no  Notes to clinic:  Last office visit 12/09/17; pt of Dr Norberto Sorenson    Requested Prescriptions  Pending Prescriptions Disp Refills   tizanidine (ZANAFLEX) 6 MG capsule 30 capsule 0    Sig: Take 1 capsule (6 mg total) by mouth at bedtime.     Not Delegated - Cardiovascular:  Alpha-2 Agonists - tizanidine Failed - 01/25/2019 11:06 AM      Failed - This refill cannot be delegated      Failed - Valid encounter within last 6 months    Recent Outpatient Visits          1 year ago Hypothyroidism due to acquired atrophy of thyroid   [facility] Levell July, MD   1 year ago Hypothyroidism due to acquired atrophy of thyroid   [facility] Levell July, MD   2 years ago Hypothyroidism due to acquired atrophy of thyroid   [facility] Levell July, MD   3 years ago Sinus headache   [facility], Levell July, MD   4 years ago Neck strain, initial encounter   [facility], Levell July, MD

## 2019-01-25 NOTE — Telephone Encounter (Signed)
Copied from CRM #[id number]. Topic: Quick Communication - Rx Refill/Question °>> Jan 25, 2019 10:44 AM Blakes, Chris L wrote: °Medication: tizanidine (ZANAFLEX) 6 MG capsule  ° °[facility] ° ° °Preferred Pharmacy (with phone number or street name): [id number] - [place], [place] - [address] [phone] (Phone) °[phone] (Fax) ° ° ° °Agent: Please be advised that RX refills may take up to 3 business days. We ask that you follow-up with your pharmacy. °

## 2019-01-31 ENCOUNTER — Telehealth: Payer: Self-pay | Admitting: Family Medicine

## 2019-01-31 NOTE — Telephone Encounter (Signed)
Copied from CRM [id number]. Topic: Quick Communication - Rx Refill/Question >> Jan 25, 2019 10:44 AM Donita Brooks wrote: Medication: tizanidine (ZANAFLEX) 6 MG capsule   [facility]   Preferred Pharmacy (with phone number or street name): [id number] - [place], [place] - [address] AT [facility] [address] [phone] (Phone) [phone] (Fax)    Agent: Please be advised that RX refills may take up to 3 business days. We ask that you follow-up with your pharmacy.

## 2019-03-12 ENCOUNTER — Other Ambulatory Visit: Payer: Self-pay | Admitting: Family Medicine

## 2019-03-12 NOTE — Telephone Encounter (Signed)
Please review

## 2019-03-16 ENCOUNTER — Other Ambulatory Visit: Payer: Self-pay | Admitting: Family Medicine

## 2019-03-16 NOTE — Telephone Encounter (Signed)
Can the patient receive a refill on this medication

## 2019-03-25 ENCOUNTER — Telehealth: Payer: Self-pay | Admitting: Neurology

## 2019-03-25 DIAGNOSIS — G43711 Chronic migraine without aura, intractable, with status migrainosus: Secondary | ICD-10-CM

## 2019-03-25 NOTE — Telephone Encounter (Signed)
Pt called in and requested a refill of Erenumab-aooe (AIMOVIG) 140 MG/ML SOAJ to [id number] [place], [place] - [address] AT [facility]

## 2019-03-28 MED ORDER — ERENUMAB-AOOE 140 MG/ML ~~LOC~~ SOAJ: 140.0000 mg | SUBCUTANEOUS | 3 refills | Status: DC

## 2019-03-28 NOTE — Telephone Encounter (Signed)
[facility] and spoke with pharmacy staff who stated the Aimovig was already canceled. [facility] [place] [facility] pt request. Pt will be due for a f/u on or around Sept 2019.

## 2019-06-15 NOTE — Telephone Encounter (Signed)
Followed Dr. Brigitte Pulse to new practice

## 2019-07-26 ENCOUNTER — Telehealth: Payer: Self-pay | Admitting: *Deleted

## 2019-07-26 NOTE — Telephone Encounter (Signed)
Completed Aimovig PA on CMM. KEY: Key: [id number]. Approved immediately by [facility].   KSHNGI:[id number];VEZBMZ:[id number];Review Type:Prior Auth;Coverage Start Date:06/26/2019;Coverage End Date:07/25/2020;

## 2020-03-08 ENCOUNTER — Other Ambulatory Visit: Payer: Self-pay | Admitting: Obstetrics and Gynecology

## 2020-03-08 DIAGNOSIS — R928 Other abnormal and inconclusive findings on diagnostic imaging of breast: Secondary | ICD-10-CM

## 2020-03-13 ENCOUNTER — Ambulatory Visit
Admission: RE | Admit: 2020-03-13 | Discharge: 2020-03-13 | Disposition: A | Discharge status: Home / Self Care | Payer: 59 | Source: Ambulatory Visit | Attending: Obstetrics and Gynecology | Admitting: Obstetrics and Gynecology

## 2020-03-13 ENCOUNTER — Ambulatory Visit: Payer: 59

## 2020-03-13 ENCOUNTER — Other Ambulatory Visit: Payer: Self-pay

## 2020-03-13 DIAGNOSIS — R928 Other abnormal and inconclusive findings on diagnostic imaging of breast: Secondary | ICD-10-CM

## 2021-05-15 ENCOUNTER — Ambulatory Visit: Payer: Self-pay | Admitting: Cardiology

## 2021-06-06 ENCOUNTER — Ambulatory Visit: Payer: Self-pay | Admitting: Cardiology

## 2021-06-12 ENCOUNTER — Encounter: Payer: Self-pay | Admitting: Gastroenterology

## 2021-06-12 ENCOUNTER — Ambulatory Visit (INDEPENDENT_AMBULATORY_CARE_PROVIDER_SITE_OTHER): Payer: 59 | Admitting: Gastroenterology

## 2021-06-12 VITALS — BP 144/90 | HR 68 | Ht 64.5 in | Wt 149.8 lb

## 2021-06-12 DIAGNOSIS — K581 Irritable bowel syndrome with constipation: Secondary | ICD-10-CM

## 2021-06-12 DIAGNOSIS — G8929 Other chronic pain: Secondary | ICD-10-CM

## 2021-06-12 DIAGNOSIS — R1013 Epigastric pain: Secondary | ICD-10-CM

## 2021-06-12 DIAGNOSIS — Z1211 Encounter for screening for malignant neoplasm of colon: Secondary | ICD-10-CM

## 2021-06-12 MED ORDER — LUBIPROSTONE 8 MCG PO CAPS: 8.0000 ug | ORAL_CAPSULE | Freq: Two times a day (BID) | ORAL | 1 refills | Status: DC

## 2021-06-12 MED ORDER — PLENVU 140 G PO SOLR: ORAL | 0 refills | Status: DC

## 2021-06-12 NOTE — Progress Notes (Signed)
HPI : Erin Anderson is a very pleasant 60 year old female referred by Dr. Norberto Sorenson for further evaluation of constipation and abdominal pain.  The patient states that she has been having the symptoms for several years now.  She has been trying numerous over-the-counter medications and dietary changes without much success.  She reports going several days without a bowel movement unless she takes a laxative.  Even when she does take a laxative, she continues to have small volume stools which are unsatisfactory.  She reports difficulty with straining frequently, and prolonged bowel movements.  No blood in her stool.  Diarrhea is never a problem for her.  She has lower abdominal pain and rectal pain which can worsen with her constipation, but also worsens following bowel movements.  She has tried taking numerous fiber supplement formulations, aloe vera liquid, magnesium supplements, stimulant laxatives and MiraLAX.  The fiber supplements were not effective, the magnesium supplements gave her diarrhea, and stimulant laxatives and MiraLAX worsens her abdominal pain.  She is also bothered by upper abdominal pain.  This pain is often of a sharp stabbing quality.  Does not seem to be reliably associated with meals.  It has interfered with her sleep on occasion.  It is associated with nausea and excessive belching but no vomiting.  She denies a burning pain in her epigastrium or chest.  No acid regurgitation.  She started taking omeprazole for this upper abdominal pain about a month ago and has not noticed any significant improvement. The patient does suffer from severe migraines and takes many medications to help her deal with this.  This includes occasional Fioricet, Ultram and Goody's headache powder.  The Fioricet and Ultram she takes rarely, but the Marlin Canary she takes several times a week.  She thinks she had a colonoscopy about 10 years ago, and that it was normal.  She denies family history of colon cancer.  This  colonoscopy report is not available for review.  There are notes in the chart indicating Dr. Jennye Boroughs office was trying to contact her in 2018 for a repeat colonoscopy but there is no indication why (i.e., polyps versus inadequate prep)   Past Medical History:  Diagnosis Date   Allergy    Anxiety    Depression    GERD (gastroesophageal reflux disease)    Herpes zoster    Migraines    Ovarian cyst    recurrent   SUI (stress urinary incontinence, female) 04/01/2018   Thyroid disease    cysts     Past Surgical History:  Procedure Laterality Date   CESAREAN SECTION  1988   ENDOMETRIAL ABLATION  2012   TONSILLECTOMY AND ADENOIDECTOMY  1963   Family History  Problem Relation Age of Onset   Migraines Mother    Alcohol abuse Father        died at age 28 from alcohol related issues   Alzheimer's disease Maternal Grandmother    Brain cancer Maternal Grandfather    Migraines Maternal Grandfather    Colon cancer Neg Hx    Esophageal cancer Neg Hx    Pancreatic cancer Neg Hx    Stomach cancer Neg Hx    Social History   Tobacco Use   Smoking status: Former    Packs/day: 1.00    Years: 10.00    Pack years: 10.00    Types: Cigarettes    Quit date: 12/31/1985    Years since quitting: 35.4   Smokeless tobacco: Never  Vaping Use   Vaping  Use: Never used  Substance Use Topics   Alcohol use: Yes    Comment: 1-2 weekly   Drug use: No   Current Outpatient Medications  Medication Sig Dispense Refill   AJOVY 225 MG/1.5ML SOSY Inject 1 Syringe into the skin every 30 (thirty) days.     butalbital-acetaminophen-caffeine (FIORICET, ESGIC) 50-325-40 MG tablet take 1 to 2 tablets by mouth every 6 hours if needed for headache 60 tablet 0   cetirizine (ZYRTEC) 10 MG tablet Take 1 tablet (10 mg total) by mouth at bedtime. 90 tablet 1   diclofenac sodium (VOLTAREN) 1 % GEL Apply 4 g topically 4 (four) times daily. (Patient taking differently: Apply 4 g topically 4 (four) times daily as needed.)  100 g 2   Estradiol-Norethindrone Acet 0.5-0.1 MG tablet Take 1 tablet by mouth daily.     fluticasone (FLONASE) 50 MCG/ACT nasal spray SHAKE LIQUID AND USE 2 SPRAYS IN EACH NOSTRIL EVERY DAY AT BEDTIME 16 g 2   levothyroxine (SYNTHROID, LEVOTHROID) 88 MCG tablet Take 1 tablet (88 mcg total) by mouth daily before breakfast. 90 tablet 1   LORazepam (ATIVAN) 0.5 MG tablet TAKE 1 TABLET BY MOUTH EVERY 8 HOURS IF NEEDED FOR ANXIETY 60 tablet 0   omeprazole (PRILOSEC) 40 MG capsule Take 40 mg by mouth daily.     POLYETHYLENE GLYCOL 3350 PO Take 17 g by mouth daily as needed.     Rimegepant Sulfate (NURTEC) 75 MG TBDP Take 1 tablet by mouth as needed.     sertraline (ZOLOFT) 50 MG tablet Take 50 mg by mouth daily.     tizanidine (ZANAFLEX) 6 MG capsule Take 1 capsule (6 mg total) by mouth at bedtime. (Patient taking differently: Take 6 mg by mouth at bedtime as needed.) 30 capsule 0   traMADol (ULTRAM) 50 MG tablet take 1 tablet by mouth every 8 hours if needed 60 tablet 0   verapamil (VERELAN PM) 180 MG 24 hr capsule Take 180 mg by mouth at bedtime.     No current facility-administered medications for this visit.   Allergies  Allergen Reactions   Sulfa Antibiotics    Sulfonamide Derivatives    Meloxicam Rash     Review of Systems: All systems reviewed and negative except where noted in HPI.    No results found.  Physical Exam: BP (!) 144/90   Pulse 68   Ht 5' 4.5" (1.638 m)   Wt 149 lb 12.8 oz (67.9 kg)   BMI 25.32 kg/m  Constitutional: Pleasant,well-developed, Caucasian female in no acute distress. HEENT: Normocephalic and atraumatic. Conjunctivae are normal. No scleral icterus.  Mallampati 2 Cardiovascular: Normal rate, regular rhythm.  Pulmonary/chest: Effort normal and breath sounds normal. No wheezing, rales or rhonchi. Abdominal: Soft, nondistended, nontender. Bowel sounds active throughout. There are no masses palpable. No hepatomegaly. Extremities: no edema Neurological:  Alert and oriented to person place and time. Skin: Skin is warm and dry. No rashes noted. Psychiatric: Normal mood and affect. Behavior is normal.  CBC    Component Value Date/Time   WBC 7.9 02/19/2017 1655   WBC 7.1 03/02/2015 1550   RBC 4.55 02/19/2017 1655   RBC 4.85 03/02/2015 1550   HGB 14.2 02/19/2017 1655   HCT 41.5 02/19/2017 1655   PLT 367 02/19/2017 1655   MCV 91 02/19/2017 1655   MCH 31.2 02/19/2017 1655   MCH 31.1 03/02/2015 1550   MCHC 34.2 02/19/2017 1655   MCHC 34.4 03/02/2015 1550   RDW 14.2 02/19/2017 1655  LYMPHSABS 2.0 02/11/2013 1258   MONOABS 0.5 02/11/2013 1258   EOSABS 0.2 02/11/2013 1258   BASOSABS 0.0 02/11/2013 1258    CMP     Component Value Date/Time   NA 137 12/09/2017 1731   K 3.8 12/09/2017 1731   CL 101 12/09/2017 1731   CO2 24 12/09/2017 1731   GLUCOSE 78 12/09/2017 1731   GLUCOSE 81 04/23/2016 1148   BUN 14 12/09/2017 1731   CREATININE 0.71 12/09/2017 1731   CREATININE 0.58 04/23/2016 1148   CALCIUM 9.5 12/09/2017 1731   PROT 7.4 12/09/2017 1731   ALBUMIN 4.5 12/09/2017 1731   AST 32 12/09/2017 1731   ALT 28 12/09/2017 1731   ALKPHOS 78 12/09/2017 1731   BILITOT 0.2 12/09/2017 1731   GFRNONAA 95 12/09/2017 1731   GFRAA 109 12/09/2017 1731     ASSESSMENT AND PLAN: 60 year old female with several year history of difficulty with constipation and abdominal pain, not responsive to multiple over-the-counter laxatives to include bulk laxatives, stimulant laxatives and osmotic laxatives.  This history is most consistent with IBS-C, however there may be an element of pelvic floor dyssynergia.  I am hopeful that her abdominal pain will improve with more satisfactory bowel movements.  I recommended we try Amitiza 8 mcg twice a day to give her more satisfactory bowel movements.  We discussed potential side effects of diarrhea and nausea.   Regarding her epigastric pain, she does take frequent Goody's for her migraines, so is at high risk of  peptic ulcer disease.  Her pain is not improving with omeprazole, so I think that upper endoscopy is warranted The patient was apparently due for a colonoscopy in 2018, however it is unclear whether she has a history of polyps or not.  We have contacted Dr. Jennye Boroughs office to obtain the colonoscopy report from her initial colonoscopy and are awaiting a response.  IBS-C - Trial of Amitiza 8 mcg twice a day - Consider anorectal manometry if no improvement in constipation with Amitiza  Epigastric pain -- Continue omeprazole -Diagnostic upper endoscopy to evaluate for peptic ulcer disease  Screening versus surveillance colonoscopy - Patient was apparently due for colonoscopy in 2018 per Dr. Kinnie Scales - We will schedule for colonoscopy now, and try to obtain records from initial colonoscopy to see if polyps were present to guide recommendations for further screening/surveillance  The details, risks (including bleeding, perforation, infection, missed lesions, medication reactions and possible hospitalization or surgery if complications occur), benefits, and alternatives to EGD/colonoscopy with possible biopsy and possible polypectomy were discussed with the patient and she consents to proceed.   I spent a total of 45 minutes reviewing the patient's medical record, interviewing and examining the patient, discussing her diagnosis and management of her condition going forward, and documenting in the medical record

## 2021-06-12 NOTE — Patient Instructions (Addendum)
If you are age 61 or older, your body mass index should be between 23-30. Your Body mass index is 25.32 kg/m. If this is out of the aforementioned range listed, please consider follow up with your Primary Care Provider.  If you are age 27 or younger, your body mass index should be between 19-25. Your Body mass index is 25.32 kg/m. If this is out of the aformentioned range listed, please consider follow up with your Primary Care Provider.   You have been scheduled for an endoscopy and colonoscopy. Please follow the written instructions given to you at your visit today. Please pick up your prep supplies at the pharmacy within the next 1-3 days. If you use inhalers (even only as needed), please bring them with you on the day of your procedure.   The  GI providers would like to encourage you to use [facility] to communicate with providers for non-urgent requests or questions.  Due to long hold times on the telephone, sending your provider a message by [facility] may be a faster and more efficient way to get a response.  Please allow 48 business hours for a response.  Please remember that this is for non-urgent requests.   It was a pleasure to see you today!  Thank you for trusting me with your gastrointestinal care!    Scott E. Tomasa Rand, MD

## 2021-06-14 ENCOUNTER — Telehealth: Payer: Self-pay | Admitting: Gastroenterology

## 2021-06-14 NOTE — Telephone Encounter (Signed)
Hi Dr. Tomasa Rand,  Patient called this morning to inform the office that unfortunately she would need to request a transfer of care over to Dr. Orvan Falconer due to insurance. You fall into her policy as non-par or Tier 1 which is higher in cost for her.   Please advise on scheduling for she is schedule for a double to be done in September.    Thanks

## 2021-06-14 NOTE — Telephone Encounter (Signed)
Returned patients call letting her know that prior authorization for Amitiza was submitted and that I would let her know when I received a response.

## 2021-06-14 NOTE — Telephone Encounter (Signed)
Patient called to inform us that the Amitiza requires [employer] [phone]

## 2021-06-18 NOTE — Telephone Encounter (Signed)
I agree with Dr. Orvan Falconer.  This seems likely a misunderstanding on the part of the patient's insurance company, perhaps related to Dr. Milas Hock recent change from [employer] service to private practice.  Continuity of care would be best served by having Dr. Tomasa Rand perform the procedures once this issue can be worked out with [employer].  - HD

## 2021-06-18 NOTE — Telephone Encounter (Signed)
I spoke with pt and she informed me that Dr Tomasa Rand is showing in network but he is not showing as a Tier 1 provider which doesn't allow enough coverage for her procedure, Dr Orvan Falconer and Dr Myrtie Neither are tier 1 providers so the pt would like to know if she could be switched to one of them instead.

## 2021-06-18 NOTE — Telephone Encounter (Signed)
Left msg on pt's vm informing her that after speaking with [facility] Dr Tomasa Rand does appear to be in network since he is a part of [place] effective 05/12/2021.

## 2021-06-18 NOTE — Telephone Encounter (Signed)
Please ask Siera to investigate and correct with [employer].I am happy to assist, however, this needs to be corrected with the insurance and it would be best to have Dr. Tomasa Rand perform the procedure.

## 2021-06-18 NOTE — Telephone Encounter (Signed)
I have reached out to our Provider Payor Enrollment Specialist in the Business Office to get this corrected.

## 2021-06-24 NOTE — Telephone Encounter (Signed)
Informed pt that we are getting in touch with the Provider Payor Enrollment Specialist and that Dr Orvan Falconer and Dr Myrtie Neither prefer for Dr Tomasa Rand to perform the procedure, pt voiced understanding

## 2021-07-01 ENCOUNTER — Ambulatory Visit: Payer: 59 | Admitting: Family Medicine

## 2021-07-08 ENCOUNTER — Ambulatory Visit: Payer: 59

## 2021-07-24 ENCOUNTER — Telehealth: Payer: Self-pay | Admitting: Gastroenterology

## 2021-07-24 NOTE — Telephone Encounter (Signed)
Needs to speak to [facility] about status of results from the Provider Payor Enrollment Specialist. She wants a call back on 07/25/21 please.  She said she may cancel procedure.  I originally called her to say her insurance required a referral set up with insurance company to be seen with [place].  [employer] had no referrals on file for [place].

## 2021-07-30 NOTE — Telephone Encounter (Signed)
Jari Sportsman and I have been working with [employer] since July to get this insurance situation resolved for this patient.  [employer] stated:  "Many of those tiered plans use the designations in the Premium program to determine who is eligible for tier 1.  Typically, if a provider achieves the designation of 'Premium Care' they are eligible for tier 1.  All other designations or non-assessed specialties would be the members standard rate.'"   This is something [facility] assigns a provider and can vary by the patient's insurance plan. We have not given up and will continue to get this straighten out. In the meantime this patient has been very patient with [place]. I did have to cancel her double procedure w/Dr Tomasa Rand that had been scheduled for Thurs 08-01-21 until further notice.   She prefers to stay with [facility], nothing at all against Dr Tomasa Rand, tells me he was great when she was seen back in July and is asking if he can please allow her to  transfer her care to Dr Orvan Falconer for the sole reason of her insurance telling her she is considered Tier 1 in our group.

## 2021-07-30 NOTE — Telephone Encounter (Signed)
Called patient to go over some additional information for her left voicemail.

## 2021-08-01 ENCOUNTER — Encounter: Payer: 59 | Admitting: Gastroenterology

## 2021-08-16 ENCOUNTER — Encounter: Payer: Self-pay | Admitting: Gastroenterology

## 2021-08-20 IMAGING — MG MM DIGITAL DIAGNOSTIC UNILAT*L* W/ TOMO W/ CAD
4 series · 4 of 12 positions shown · non-contrast
Comparison: Previous exam(s).

CLINICAL DATA: 59-year-old female recalled from screening mammogram
dated 03/06/2020 for possible left breast distortion.

EXAM:
DIGITAL DIAGNOSTIC UNILATERAL LEFT MAMMOGRAM WITH CAD AND TOMO

[L ML synth-2D]
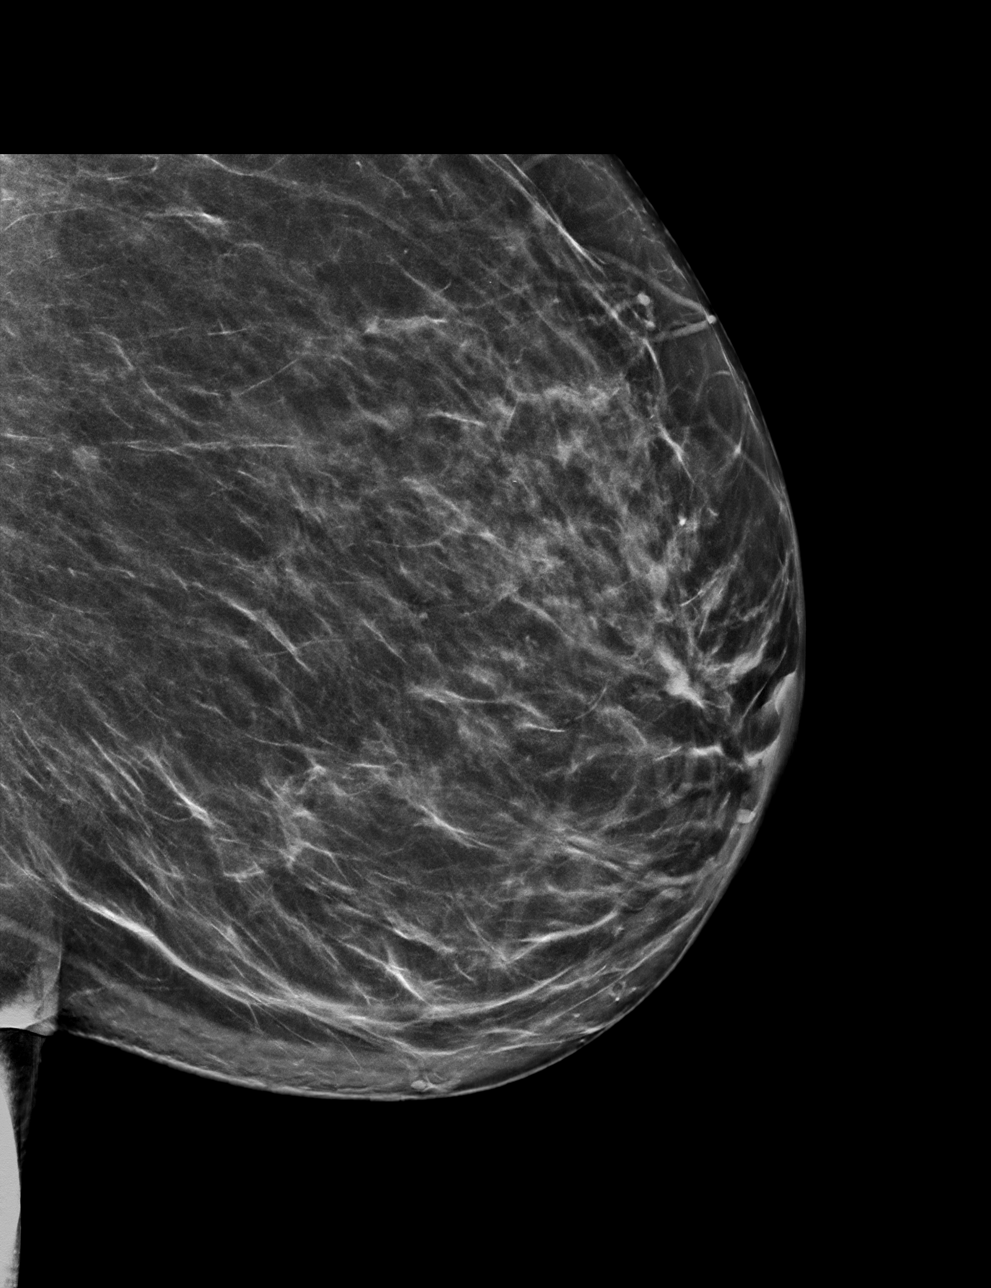

[L CC synth-2D]
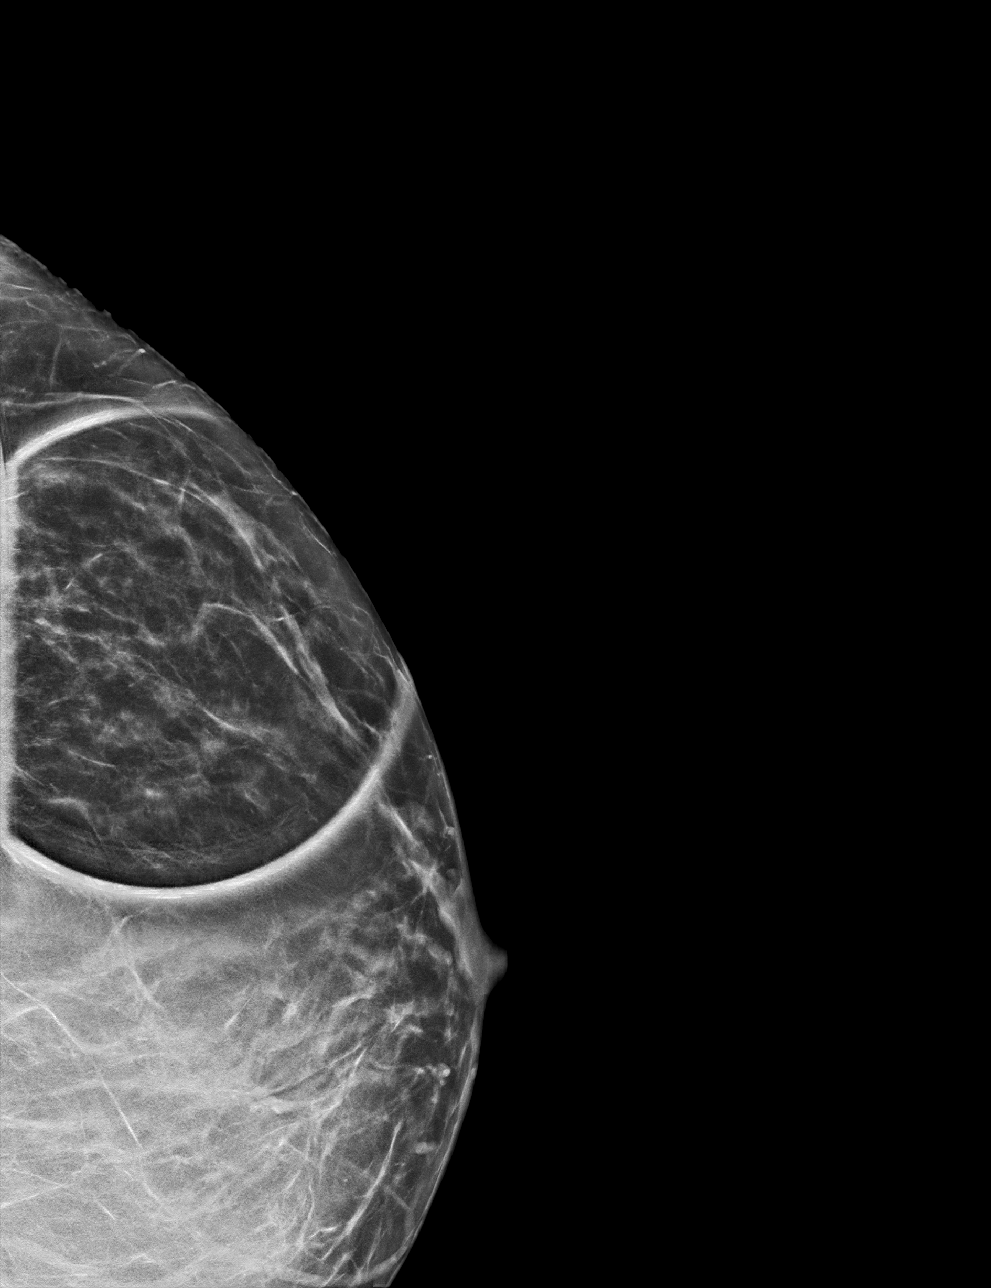

[L ML tomo · tomo slice 39/76.0]
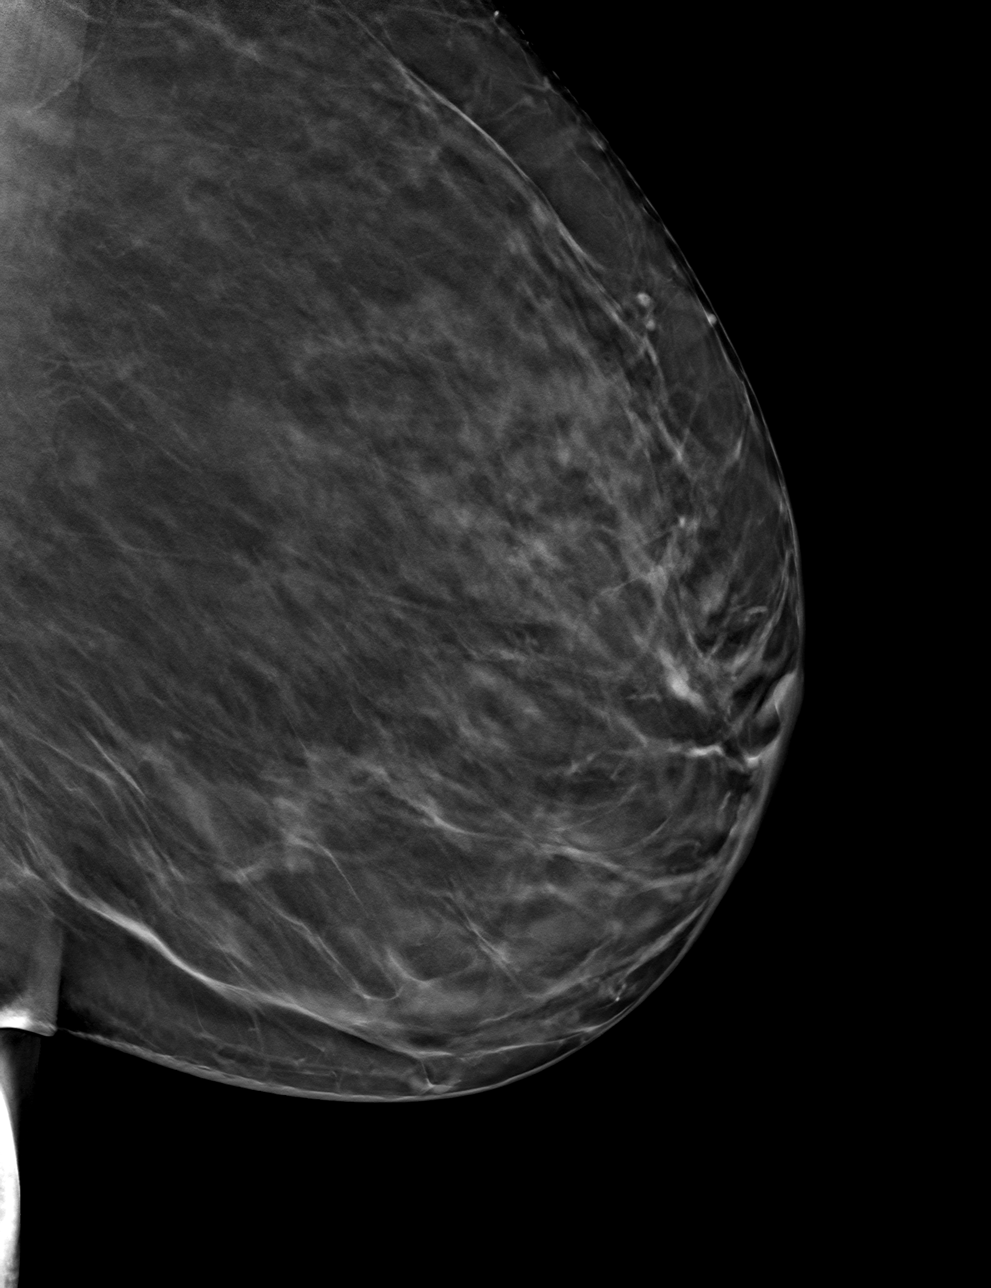

[L CC tomo · tomo slice 28/55.0]
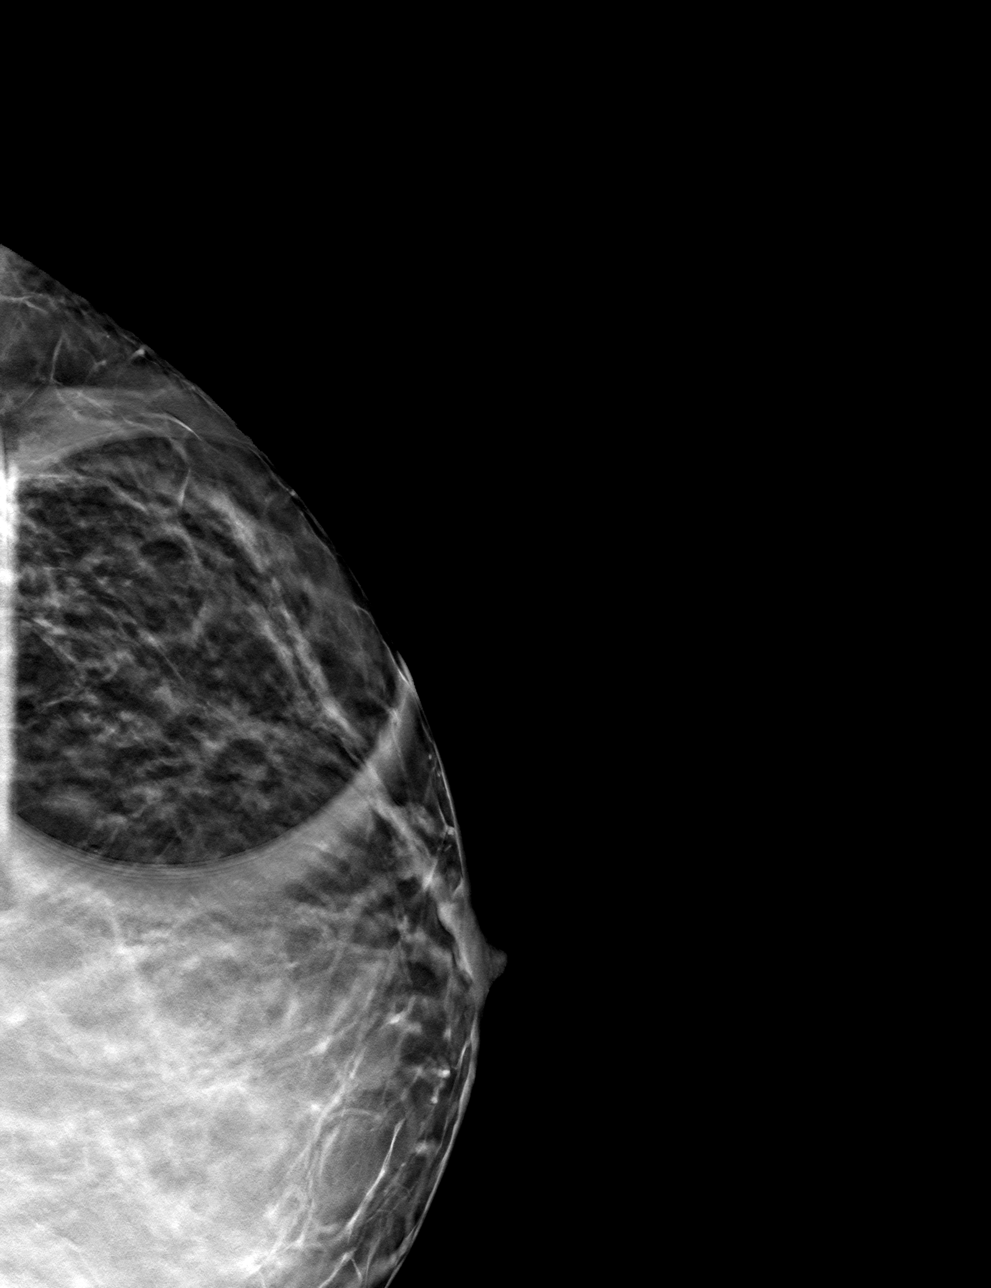

[4 of 12 positions shown; findings below may reference images not displayed]

ACR Breast Density Category b: There are scattered areas of
fibroglandular density.
FINDINGS: The previously described, possible distortion in the lateral left
breast at middle depth as seen on the cc projection only completely
resolves on today's additional views. No suspicious findings are
identified.

Mammographic images were processed with CAD.
IMPRESSION: No mammographic evidence of malignancy.

RECOMMENDATION:
Screening mammogram in one year.(Code:[id number]-LG1)

I have discussed the findings and recommendations with the patient.
If applicable, a reminder letter will be sent to the patient
regarding the next appointment.

BI-RADS CATEGORY  1: Negative.

## 2021-10-10 ENCOUNTER — Other Ambulatory Visit: Payer: Self-pay

## 2021-10-10 ENCOUNTER — Encounter: Payer: Self-pay | Admitting: Gastroenterology

## 2021-10-10 ENCOUNTER — Ambulatory Visit (AMBULATORY_SURGERY_CENTER): Payer: 59 | Admitting: *Deleted

## 2021-10-10 VITALS — Ht 64.5 in | Wt 152.0 lb

## 2021-10-10 DIAGNOSIS — R1013 Epigastric pain: Secondary | ICD-10-CM

## 2021-10-10 DIAGNOSIS — Z1211 Encounter for screening for malignant neoplasm of colon: Secondary | ICD-10-CM

## 2021-10-10 DIAGNOSIS — G8929 Other chronic pain: Secondary | ICD-10-CM

## 2021-10-10 NOTE — Progress Notes (Signed)
PV completed over the phone. Pt verified name, DOB, address and insurance during PV today.  Pt mailed instruction packet with copy of consent form to read and not return, and instructions.   Pt encouraged to call with questions or issues.  If pt has My chart, procedure instructions sent via My Chart   No egg or soy allergy known to patient  No issues known to pt with past sedation with any surgeries or procedures Patient denies ever being told they had issues or difficulty with intubation  No FH of Malignant Hyperthermia Pt is not on diet pills Pt is not on  home 02  Pt is not on blood thinners  Pt denies issues with constipation  No A fib or A flutter  Pt is fully vaccinated  for Covid   Due to the COVID-19 pandemic we are asking patients to follow certain guidelines in PV and the [place]   Pt aware of COVID protocols and [facility] guidelines   Pt has a Plenvu kit at home

## 2021-10-24 ENCOUNTER — Other Ambulatory Visit: Payer: Self-pay

## 2021-10-24 ENCOUNTER — Ambulatory Visit (AMBULATORY_SURGERY_CENTER): Payer: 59 | Admitting: Gastroenterology

## 2021-10-24 ENCOUNTER — Encounter: Payer: Self-pay | Admitting: Gastroenterology

## 2021-10-24 VITALS — BP 125/78 | HR 67 | Temp 97.5°F | Resp 12 | Ht 60.0 in | Wt 152.0 lb

## 2021-10-24 DIAGNOSIS — K319 Disease of stomach and duodenum, unspecified: Secondary | ICD-10-CM

## 2021-10-24 DIAGNOSIS — K2289 Other specified disease of esophagus: Secondary | ICD-10-CM

## 2021-10-24 DIAGNOSIS — R1013 Epigastric pain: Secondary | ICD-10-CM

## 2021-10-24 DIAGNOSIS — G8929 Other chronic pain: Secondary | ICD-10-CM

## 2021-10-24 DIAGNOSIS — K219 Gastro-esophageal reflux disease without esophagitis: Secondary | ICD-10-CM

## 2021-10-24 DIAGNOSIS — Z1211 Encounter for screening for malignant neoplasm of colon: Secondary | ICD-10-CM

## 2021-10-24 MED ORDER — SODIUM CHLORIDE 0.9 % IV SOLN: 500.0000 mL | Freq: Once | INTRAVENOUS | Status: DC

## 2021-10-24 NOTE — Progress Notes (Signed)
Referring Provider: Sherren Mocha, MD Primary Care Physician:  Sherren Mocha, MD  Reason for Procedure:  Colon cancer screening   IMPRESSION:  Need for colon cancer screening Epigastric pain with EGD recommended to evaluate for peptic ulcer disease IBS-C Appropriate candidate for monitored anesthesia care  PLAN: Colonoscopy in the [facility] today   HPI: Erin Anderson is a 60 y.o. female presents for screening colonoscopy and upper endoscopy to evaluate epigastric pain. Please see Dr. Milas Hock consultation note for complete details. Procedures scheduled with me due to to insurance.     Past Medical History:  Diagnosis Date   Allergy    Anxiety    Depression    GERD (gastroesophageal reflux disease)    Herpes zoster    Migraines    Ovarian cyst    recurrent   SUI (stress urinary incontinence, female) 04/01/2018   Thyroid disease    cysts    Past Surgical History:  Procedure Laterality Date   CESAREAN SECTION  1988   COLONOSCOPY  2008   ENDOMETRIAL ABLATION  2012   TONSILLECTOMY AND ADENOIDECTOMY  1963    Current Outpatient Medications  Medication Sig Dispense Refill   AJOVY 225 MG/1.5ML SOSY Inject 1 Syringe into the skin every 30 (thirty) days.     busPIRone (BUSPAR) 15 MG tablet Take 15 mg by mouth 2 (two) times daily.     butalbital-acetaminophen-caffeine (FIORICET, ESGIC) 50-325-40 MG tablet take 1 to 2 tablets by mouth every 6 hours if needed for headache 60 tablet 0   cetirizine (ZYRTEC) 10 MG tablet Take 1 tablet (10 mg total) by mouth at bedtime. 90 tablet 1   Estradiol-Norethindrone Acet 0.5-0.1 MG tablet Take 1 tablet by mouth daily.     levothyroxine (SYNTHROID, LEVOTHROID) 88 MCG tablet Take 1 tablet (88 mcg total) by mouth daily before breakfast. 90 tablet 1   LORazepam (ATIVAN) 0.5 MG tablet TAKE 1 TABLET BY MOUTH EVERY 8 HOURS IF NEEDED FOR ANXIETY 60 tablet 0   Rimegepant Sulfate (NURTEC) 75 MG TBDP Take 1 tablet by mouth as needed.     diclofenac  sodium (VOLTAREN) 1 % GEL Apply 4 g topically 4 (four) times daily. (Patient taking differently: Apply 4 g topically 4 (four) times daily as needed.) 100 g 2   FIBER ADULT GUMMIES PO Take by mouth.     fluticasone (FLONASE) 50 MCG/ACT nasal spray SHAKE LIQUID AND USE 2 SPRAYS IN EACH NOSTRIL EVERY DAY AT BEDTIME 16 g 2   omeprazole (PRILOSEC) 40 MG capsule Take 40 mg by mouth daily.     tizanidine (ZANAFLEX) 6 MG capsule Take 1 capsule (6 mg total) by mouth at bedtime. (Patient taking differently: Take 6 mg by mouth at bedtime as needed.) 30 capsule 0   traMADol (ULTRAM) 50 MG tablet take 1 tablet by mouth every 8 hours if needed 60 tablet 0   valsartan-hydrochlorothiazide (DIOVAN-HCT) 160-25 MG tablet Take 1 tablet by mouth daily.     Current Facility-Administered Medications  Medication Dose Route Frequency Provider Last Rate Last Admin   0.9 %  sodium chloride infusion  500 mL Intravenous Once Tressia Danas, MD        Allergies as of 10/24/2021 - Review Complete 10/24/2021  Allergen Reaction Noted   Sulfa antibiotics  12/30/2011   Sulfonamide derivatives     Meloxicam Rash 05/05/2013    Family History  Problem Relation Age of Onset   Migraines Mother    Alcohol abuse Father  died at age 76 from alcohol related issues   Alzheimer's disease Maternal Grandmother    Brain cancer Maternal Grandfather    Migraines Maternal Grandfather    Colon cancer Neg Hx    Esophageal cancer Neg Hx    Pancreatic cancer Neg Hx    Stomach cancer Neg Hx    Colon polyps Neg Hx    Rectal cancer Neg Hx      Physical Exam: General:   Alert,  well-nourished, pleasant and cooperative in NAD Head:  Normocephalic and atraumatic. Eyes:  Sclera clear, no icterus.   Conjunctiva pink. Mouth:  No deformity or lesions.   Neck:  Supple; no masses or thyromegaly. Lungs:  Clear throughout to auscultation.   No wheezes. Heart:  Regular rate and rhythm; no murmurs. Abdomen:  Soft, non-tender,  nondistended, normal bowel sounds, no rebound or guarding.  Msk:  Symmetrical. No boney deformities LAD: No inguinal or umbilical LAD Extremities:  No clubbing or edema. Neurologic:  Alert and  oriented x4;  grossly nonfocal Skin:  No obvious rash or bruise. Psych:  Alert and cooperative. Normal mood and affect.    Fynlee Rowlands L. Orvan Falconer, MD, MPH 10/24/2021, 8:49 AM

## 2021-10-24 NOTE — Patient Instructions (Signed)
Handouts on diverticulosis and high fiber diet given to patient. Await pathology results. Repeat colonoscopy in 10 years for surveillance.   YOU HAD AN ENDOSCOPIC PROCEDURE TODAY AT THE [facility]:   Refer to the procedure report that was given to you for any specific questions about what was found during the examination.  If the procedure report does not answer your questions, please call your gastroenterologist to clarify.  If you requested that your care partner not be given the details of your procedure findings, then the procedure report has been included in a sealed envelope for you to review at your convenience later.  YOU SHOULD EXPECT: Some feelings of bloating in the abdomen. Passage of more gas than usual.  Walking can help get rid of the air that was put into your GI tract during the procedure and reduce the bloating. If you had a lower endoscopy (such as a colonoscopy or flexible sigmoidoscopy) you may notice spotting of blood in your stool or on the toilet paper. If you underwent a bowel prep for your procedure, you may not have a normal bowel movement for a few days.  Please Note:  You might notice some irritation and congestion in your nose or some drainage.  This is from the oxygen used during your procedure.  There is no need for concern and it should clear up in a day or so.  SYMPTOMS TO REPORT IMMEDIATELY:  Following lower endoscopy (colonoscopy or flexible sigmoidoscopy):  Excessive amounts of blood in the stool  Significant tenderness or worsening of abdominal pains  Swelling of the abdomen that is new, acute  Fever of 100F or higher  Following upper endoscopy (EGD)  Vomiting of blood or coffee ground material  New chest pain or pain under the shoulder blades  Painful or persistently difficult swallowing  New shortness of breath  Fever of 100F or higher  Black, tarry-looking stools  For urgent or emergent issues, a gastroenterologist can be reached at  any hour by calling [phone]. Do not use MyChart messaging for urgent concerns.    DIET:  We do recommend a small meal at first, but then you may proceed to your regular diet.  Drink plenty of fluids but you should avoid alcoholic beverages for 24 hours.  ACTIVITY:  You should plan to take it easy for the rest of today and you should NOT DRIVE or use heavy machinery until tomorrow (because of the sedation medicines used during the test).    FOLLOW UP: Our staff will call the number listed on your records 48-72 hours following your procedure to check on you and address any questions or concerns that you may have regarding the information given to you following your procedure. If we do not reach you, we will leave a message.  We will attempt to reach you two times.  During this call, we will ask if you have developed any symptoms of COVID 19. If you develop any symptoms (ie: fever, flu-like symptoms, shortness of breath, cough etc.) before then, please call [phone].  If you test positive for Covid 19 in the 2 weeks post procedure, please call and report this information to [place].    If any biopsies were taken you will be contacted by phone or by letter within the next 1-3 weeks.  Please call us at [phone] if you have not heard about the biopsies in 3 weeks.    SIGNATURES/CONFIDENTIALITY: You and/or your care partner have signed paperwork which will  be entered into your electronic medical record.  These signatures attest to the fact that that the information above on your After Visit Summary has been reviewed and is understood.  Full responsibility of the confidentiality of this discharge information lies with you and/or your care-partner.

## 2021-10-24 NOTE — Op Note (Signed)
[facility] Patient Name: Erin Anderson Procedure Date: 10/24/2021 8:51 AM MRN: [id number] Endoscopist: Tressia Danas MD, MD Age: 61 Referring MD:  Date of Birth: 22-Oct-1961 Gender: Female Account #: [id number] Procedure:                Colonoscopy Indications:              Screening for colorectal malignant neoplasm Medicines:                Monitored Anesthesia Care Procedure:                Pre-Anesthesia Assessment:                           - Prior to the procedure, a History and Physical                            was performed, and patient medications and                            allergies were reviewed. The patient's tolerance of                            previous anesthesia was also reviewed. The risks                            and benefits of the procedure and the sedation                            options and risks were discussed with the patient.                            All questions were answered, and informed consent                            was obtained. Prior Anticoagulants: The patient has                            taken no previous anticoagulant or antiplatelet                            agents. ASA Grade Assessment: II - A patient with                            mild systemic disease. After reviewing the risks                            and benefits, the patient was deemed in                            satisfactory condition to undergo the procedure.                           After obtaining informed consent, the colonoscope  was passed under direct vision. Throughout the                            procedure, the patient's blood pressure, pulse, and                            oxygen saturations were monitored continuously. The                            Olympus #[id number] was introduced through the anus                            and advanced to the 3 cm into the ileum. A second                            forward view  of the right colon was performed. The                            colonoscopy was performed without difficulty. The                            patient tolerated the procedure well. The quality                            of the bowel preparation was good. The terminal                            ileum, ileocecal valve, appendiceal orifice, and                            rectum were photographed. Scope In: 9:19:56 AM Scope Out: 9:35:15 AM Scope Withdrawal Time: 0 hours 12 minutes 10 seconds  Total Procedure Duration: 0 hours 15 minutes 19 seconds  Findings:                 The perianal and digital rectal examinations were                            normal.                           A few small-mouthed diverticula were found in the                            ascending colon.                           The exam was otherwise without abnormality on                            direct and retroflexion views. Complications:            No immediate complications. Estimated Blood Loss:     Estimated blood loss: none. Impression:               - Diverticulosis in the  ascending colon.                           - The examination was otherwise normal on direct                            and retroflexion views.                           - No specimens collected. Recommendation:           - Patient has a contact number available for                            emergencies. The signs and symptoms of potential                            delayed complications were discussed with the                            patient. Return to normal activities tomorrow.                            Written discharge instructions were provided to the                            patient.                           - Resume previous diet. High fiber diet recommended.                           - Continue present medications.                           - Repeat colonoscopy in 10 years for surveillance,                             earlier with a personal history of colon cancer                            and/or family history of colon cancer or polyps.                           - Emerging evidence supports eating a diet of                            fruits, vegetables, grains, calcium, and yogurt                            while reducing red meat and alcohol may reduce the                            risk of colon cancer.                           -  Thank you for allowing me to be involved in your                            colon cancer prevention. Tressia Danas MD, MD 10/24/2021 9:40:17 AM This report has been signed electronically.

## 2021-10-24 NOTE — Progress Notes (Signed)
Pt's states no medical or surgical changes since previsit or office visit. VS by CW. 

## 2021-10-24 NOTE — Op Note (Signed)
[facility] Patient Name: Erin Anderson Procedure Date: 10/24/2021 8:57 AM MRN: [id number] Endoscopist: Tressia Danas MD, MD Age: 60 Referring MD:  Date of Birth: 07-21-1961 Gender: Female Account #: [id number] Procedure:                Upper GI endoscopy Indications:              Epigastric abdominal pain Medicines:                Monitored Anesthesia Care Procedure:                Pre-Anesthesia Assessment:                           - Prior to the procedure, a History and Physical                            was performed, and patient medications and                            allergies were reviewed. The patient's tolerance of                            previous anesthesia was also reviewed. The risks                            and benefits of the procedure and the sedation                            options and risks were discussed with the patient.                            All questions were answered, and informed consent                            was obtained. Prior Anticoagulants: The patient has                            taken no previous anticoagulant or antiplatelet                            agents. ASA Grade Assessment: II - A patient with                            mild systemic disease. After reviewing the risks                            and benefits, the patient was deemed in                            satisfactory condition to undergo the procedure.                           After obtaining informed consent, the endoscope was  passed under direct vision. Throughout the                            procedure, the patient's blood pressure, pulse, and                            oxygen saturations were monitored continuously. The                            Endoscope was introduced through the mouth, and                            advanced to the third part of duodenum. The upper                            GI endoscopy was  accomplished without difficulty.                            The patient tolerated the procedure well. Scope In: Scope Out: Findings:                 The examined esophagus was normal. Biopsies were                            taken from the distal esophagus with a cold forceps                            for histology. Estimated blood loss was minimal.                           The entire examined stomach was normal except for                            patchy erythema in the body. Biopsies were taken                            from the antrum, body, and fundus with a cold                            forceps for histology. Estimated blood loss was                            minimal.                           The examined duodenum was normal. Biopsies were                            taken with a cold forceps for histology. Estimated                            blood loss was minimal.  The cardia and gastric fundus were normal on                            retroflexion.                           The exam was otherwise without abnormality. Complications:            No immediate complications. Estimated blood loss:                            Minimal. Estimated Blood Loss:     Estimated blood loss was minimal. Impression:               - Normal esophagus. Biopsied.                           - Patchy erythema in the stomach. Biopsied.                           - Normal examined duodenum. Biopsied.                           - The examination was otherwise normal. Recommendation:           - Patient has a contact number available for                            emergencies. The signs and symptoms of potential                            delayed complications were discussed with the                            patient. Return to normal activities tomorrow.                            Written discharge instructions were provided to the                            patient.                            - Resume previous diet.                           - Continue present medications.                           - Await pathology results. Tressia Danas MD, MD 10/24/2021 9:18:28 AM This report has been signed electronically.

## 2021-10-24 NOTE — Progress Notes (Signed)
Called to room to assist during endoscopic procedure.  Patient ID and intended procedure confirmed with present staff. Received instructions for my participation in the procedure from the performing physician.  

## 2021-10-24 NOTE — Progress Notes (Signed)
To PACU, VSS. Report to [email or web address] 

## 2021-10-28 ENCOUNTER — Telehealth: Payer: Self-pay

## 2021-10-28 NOTE — Telephone Encounter (Signed)
  Follow up Call-  Call back number 10/24/2021  Post procedure Call Back phone  # [phone]  Permission to leave phone message Yes  Some recent data might be hidden     Patient questions:  Do you have a fever, pain , or abdominal swelling? No. Pain Score  0 *  Have you tolerated food without any problems? Yes.    Have you been able to return to your normal activities? Yes.    Do you have any questions about your discharge instructions: Diet   No. Medications  No. Follow up visit  No.  Do you have questions or concerns about your Care? No.  Actions: * If pain score is 4 or above: No action needed, pain <4.

## 2021-10-29 ENCOUNTER — Telehealth: Payer: Self-pay | Admitting: Gastroenterology

## 2021-10-29 ENCOUNTER — Other Ambulatory Visit: Payer: Self-pay

## 2021-10-29 MED ORDER — OMEPRAZOLE 40 MG PO CPDR: 40.0000 mg | DELAYED_RELEASE_CAPSULE | Freq: Every day | ORAL | 3 refills | Status: AC

## 2021-10-29 MED ORDER — OMEPRAZOLE 40 MG PO CPDR: DELAYED_RELEASE_CAPSULE | ORAL | 0 refills | Status: DC

## 2021-10-29 NOTE — Telephone Encounter (Signed)
Addressed in a previously created encounter 

## 2021-10-29 NOTE — Telephone Encounter (Signed)
Patient returned your call.

## 2021-11-01 ENCOUNTER — Other Ambulatory Visit: Payer: Self-pay

## 2021-11-01 DIAGNOSIS — K581 Irritable bowel syndrome with constipation: Secondary | ICD-10-CM

## 2021-11-01 DIAGNOSIS — R1013 Epigastric pain: Secondary | ICD-10-CM

## 2021-11-01 DIAGNOSIS — G8929 Other chronic pain: Secondary | ICD-10-CM

## 2021-11-01 MED ORDER — LINACLOTIDE 72 MCG PO CAPS: 72.0000 ug | ORAL_CAPSULE | Freq: Every day | ORAL | 2 refills | Status: DC

## 2021-11-04 ENCOUNTER — Telehealth: Payer: Self-pay

## 2021-11-04 NOTE — Telephone Encounter (Signed)
APPROVAL  Medication: [employer] Insurance Company: [employer] PA response: Not Required - Drug covered Misc. Notes: Edward Qualia Key: [id number] help? Call us at [phone] Outcome Additional Information Required Drug is covered by current benefit plan. No further PA activity needed

## 2021-12-24 ENCOUNTER — Ambulatory Visit: Payer: 59 | Admitting: Gastroenterology

## 2022-10-02 ENCOUNTER — Encounter: Payer: Self-pay | Admitting: Neurology

## 2023-02-23 ENCOUNTER — Ambulatory Visit: Payer: 59 | Admitting: Neurology

## 2023-06-18 NOTE — Progress Notes (Signed)
NEUROLOGY CONSULTATION NOTE  Erin Anderson MRN: [id number] DOB: Jan 16, 1961  Referring provider: Sharon Seller, NP Primary care provider: Norberto Sorenson, MD  Reason for consult:  migraine  Assessment/Plan:   Chronic migraine without aura, without status migrainosus, not intractable  15 or more migraine days a month for over 3 months.  Failed propranolol, topiramate, venlafaxine.   Migraine prevention:  plan to start Botox Migraine acute Rx:  She will try samples of Ubrelvy.  Zofran for nausea.   Advised to discontinue all OTC NSAIDs/analgesics, Fioricet and tramadol. Keep headache diary Follow up for Botox   Subjective:  Erin Anderson is a 62 year old right-handed female with HTN, hypothyroidism and IBS who presents for migraines.  History supplemented by referring provider's note.  MRI of brain from 08/02/2018 personally reviewed.  Onset: since she was a teenager, worse in her 46s Location:  unilateral retro-orbital and occipital (L>R) Quality:  stabbing, pounding Intensity:  Severe.   Aura:  absent Prodrome:  absent Postdrome:  migraine hangover for a day Associated symptoms:  Nausea, vomiting, photophobia, phonophobia, neck and shoulder pain.  She denies associated unilateral numbness or weakness. Duration:  all day (sometimes into next day).  Has had migraines lasting a week.   Frequency:  She has some kind of headache daily but has 15 to 20 days of what she calls a "migraine".   Frequency of abortive medication: Takes a pain reliever daily because she feels a very dull discomfort in her head and tries to prevent it from progressing to migraine.  Excedrin almost daily.  Tramadol for severe migraines (10 days a month).  Fioricet three to four days a week at most. Triggers:  smells (perfume, laundry detergent, candles, essential oils), change in barometric pressure Relieving factors:  resting with ice pack Activity:  Interferes with her work.    08/02/2018 MRI BRAIN W WO:   Few scattered periventricular and subcortical foci of non-specific gliosis.  No acute findings.  Past NSAIDS/analgesics:  ibuprofen, Tylenol, BC powder Past abortive triptans:  eletriptan, sumatriptan - cannot tolerate triptans Past abortive ergotamine:  none Past muscle relaxants:  Flexeril, tizanidine Past anti-emetic:  Zofran, Phenergan Past antihypertensive medications:  propranolol Past antidepressant medications:  sertraline Past anticonvulsant medications:  topiramate, gabapentin Past anti-CGRP:   Aimovig (helped - not covered), Ajovy, Nurtec PRN (variable efficacy) Past vitamins/Herbal/Supplements:  none Past antihistamines/decongestants:  none Other past therapies:  none  Current NSAIDS/analgesics:  Fioricet, tramadol, Excedrin Current triptans:  none Current ergotamine:  none Current anti-emetic:  none Current muscle relaxants:  tizanidine 4mg  Current Antihypertensive medications:  valsartan-HCTZ Current Antidepressant medications:  venlafaxine XR 37.5mg  daily (taking for depression) Current Anticonvulsant medications:  none Current anti-CGRP: none Current Vitamins/Herbal/Supplements:  magnesium Current Antihistamines/Decongestants:  Flonase, Zyrtec Other therapy:  ice pack Birth control:  estradiol-norethindrone Other medications:  Synthroid   Caffeine:  1 cup of coffee some days, cold brew 4 days a week.  Occasional soda Diet:  drinks water all day.  Does not skip meals Exercise:  inconsistent.  Enjoys walking and yoga Depression:  yes; Anxiety:  yes Other pain:  joint/hip and hand pain Sleep hygiene:  variable Family history of headache:  grandmother, cousin      PAST MEDICAL HISTORY: Past Medical History:  Diagnosis Date   Allergy    Anxiety    Depression    GERD (gastroesophageal reflux disease)    Herpes zoster    Migraines    Ovarian cyst    recurrent  SUI (stress urinary incontinence, female) 04/01/2018   Thyroid disease    cysts    PAST  SURGICAL HISTORY: Past Surgical History:  Procedure Laterality Date   CESAREAN SECTION  1988   COLONOSCOPY  2008   ENDOMETRIAL ABLATION  2012   TONSILLECTOMY AND ADENOIDECTOMY  1963    MEDICATIONS: Current Outpatient Medications on File Prior to Visit  Medication Sig Dispense Refill   AJOVY 225 MG/1.5ML SOSY Inject 1 Syringe into the skin every 30 (thirty) days.     busPIRone (BUSPAR) 15 MG tablet Take 15 mg by mouth 2 (two) times daily.     butalbital-acetaminophen-caffeine (FIORICET, ESGIC) 50-325-40 MG tablet take 1 to 2 tablets by mouth every 6 hours if needed for headache 60 tablet 0   cetirizine (ZYRTEC) 10 MG tablet Take 1 tablet (10 mg total) by mouth at bedtime. 90 tablet 1   diclofenac sodium (VOLTAREN) 1 % GEL Apply 4 g topically 4 (four) times daily. (Patient taking differently: Apply 4 g topically 4 (four) times daily as needed.) 100 g 2   Estradiol-Norethindrone Acet 0.5-0.1 MG tablet Take 1 tablet by mouth daily.     FIBER ADULT GUMMIES PO Take by mouth.     fluticasone (FLONASE) 50 MCG/ACT nasal spray SHAKE LIQUID AND USE 2 SPRAYS IN EACH NOSTRIL EVERY DAY AT BEDTIME 16 g 2   levothyroxine (SYNTHROID, LEVOTHROID) 88 MCG tablet Take 1 tablet (88 mcg total) by mouth daily before breakfast. 90 tablet 1   linaclotide (LINZESS) 72 MCG capsule Take 1 capsule (72 mcg total) by mouth daily before breakfast. 30 capsule 2   LORazepam (ATIVAN) 0.5 MG tablet TAKE 1 TABLET BY MOUTH EVERY 8 HOURS IF NEEDED FOR ANXIETY 60 tablet 0   omeprazole (PRILOSEC) 40 MG capsule Take 40 mg by mouth BID x 10 weeks, then reduce to qd 140 capsule 0   omeprazole (PRILOSEC) 40 MG capsule Take 1 capsule (40 mg total) by mouth daily. 90 capsule 3   Rimegepant Sulfate (NURTEC) 75 MG TBDP Take 1 tablet by mouth as needed.     tizanidine (ZANAFLEX) 6 MG capsule Take 1 capsule (6 mg total) by mouth at bedtime. (Patient taking differently: Take 6 mg by mouth at bedtime as needed.) 30 capsule 0   traMADol  (ULTRAM) 50 MG tablet take 1 tablet by mouth every 8 hours if needed 60 tablet 0   valsartan-hydrochlorothiazide (DIOVAN-HCT) 160-25 MG tablet Take 1 tablet by mouth daily.     No current facility-administered medications on file prior to visit.    ALLERGIES: Allergies  Allergen Reactions   Sulfa Antibiotics    Sulfonamide Derivatives    Meloxicam Rash    FAMILY HISTORY: Family History  Problem Relation Age of Onset   Migraines Mother    Alcohol abuse Father        died at age 63 from alcohol related issues   Alzheimer's disease Maternal Grandmother    Brain cancer Maternal Grandfather    Migraines Maternal Grandfather    Colon cancer Neg Hx    Esophageal cancer Neg Hx    Pancreatic cancer Neg Hx    Stomach cancer Neg Hx    Colon polyps Neg Hx    Rectal cancer Neg Hx     Objective:  Blood pressure (!) 138/93, pulse 86, height 5\' 4"  (1.626 m), weight 143 lb 8 oz (65.1 kg), SpO2 96%. General: No acute distress.  Patient appears well-groomed.   Head:  Normocephalic/atraumatic Eyes:  fundi examined but  not visualized Neck: supple, no paraspinal tenderness, full range of motion Back: No paraspinal tenderness Heart: regular rate and rhythm Lungs: Clear to auscultation bilaterally. Vascular: No carotid bruits. Neurological Exam: Mental status: alert and oriented to person, place, and time, speech fluent and not dysarthric, language intact. Cranial nerves: CN I: not tested CN II: pupils equal, round and reactive to light, visual fields intact CN III, IV, VI:  full range of motion, no nystagmus, no ptosis CN V: facial sensation intact. CN VII: upper and lower face symmetric CN VIII: hearing intact CN IX, X: gag intact, uvula midline CN XI: sternocleidomastoid and trapezius muscles intact CN XII: tongue midline Bulk & Tone: normal, no fasciculations. Motor:  muscle strength 5/5 throughout Sensation:  Pinprick, temperature and vibratory sensation intact. Deep Tendon  Reflexes:  2+ throughout,  toes downgoing.   Finger to nose testing:  Without dysmetria.   Heel to shin:  Without dysmetria.   Gait:  Normal station and stride.  Romberg negative.    Thank you for allowing me to take part in the care of this patient.  Shon Millet, DO  CC:  Norberto Sorenson, MD  Sharon Seller, NP

## 2023-06-22 ENCOUNTER — Ambulatory Visit: Payer: Commercial Managed Care - HMO | Admitting: Neurology

## 2023-06-22 ENCOUNTER — Other Ambulatory Visit (HOSPITAL_COMMUNITY): Payer: Self-pay

## 2023-06-22 ENCOUNTER — Telehealth: Payer: Self-pay

## 2023-06-22 ENCOUNTER — Encounter: Payer: Self-pay | Admitting: Neurology

## 2023-06-22 VITALS — BP 138/93 | HR 86 | Ht 64.0 in | Wt 143.5 lb

## 2023-06-22 DIAGNOSIS — G43711 Chronic migraine without aura, intractable, with status migrainosus: Secondary | ICD-10-CM

## 2023-06-22 MED ORDER — ONDANSETRON HCL 4 MG PO TABS: 4.0000 mg | ORAL_TABLET | Freq: Three times a day (TID) | ORAL | 5 refills | Status: DC | PRN

## 2023-06-22 NOTE — Telephone Encounter (Signed)
Pharmacy Patient Advocate Encounter  Received notification from [employer] that Prior Authorization for Botox 200UNIT solution has been CANCELLED due to;   This medication may be excluded from the patient's benefit. For mor information, please reach out to [facility] directly at [phone]  PA #/Case ID/Reference #: [id number]

## 2023-06-22 NOTE — Patient Instructions (Signed)
  Plan to start Botox Take Ubrelvy at earliest onset of headache.  May repeat dose once in 2 hours if needed.  Maximum 2 tablets in 24 hours.  Let me know if you want a prescription or we need to try something else Take ondansetron for nausea at earliest onset of migraine as well Stop all over the counter pain relievers, tramadol and Fioricet.  Be aware of common food triggers Routine exercise Stay adequately hydrated (aim for 64 oz water daily) Keep headache diary Maintain proper stress management Maintain proper sleep hygiene Do not skip meals Consider supplements:  magnesium citrate 400mg  daily, riboflavin 400mg  daily, coenzyme Q10 300mg  daily.

## 2023-06-22 NOTE — Telephone Encounter (Signed)
Patient seen in office today, Dr.Jaffe would like to start Botox today.   PA  team Please start a PA for Botox 200 units every 90 days.

## 2023-07-02 ENCOUNTER — Other Ambulatory Visit (HOSPITAL_COMMUNITY): Payer: Self-pay

## 2023-07-02 ENCOUNTER — Telehealth: Payer: Self-pay | Admitting: Pharmacy Technician

## 2023-07-02 NOTE — Telephone Encounter (Signed)
BotoxOne verification has been submitted. Benefit Verification:  [id number]  Medical PA has been submitted for BOTOX 200 via FAX  INSURANCE: [employer] DATE SUBMITTED: 8.8.24 FAX: [phone] Status is pending

## 2023-07-08 NOTE — Telephone Encounter (Signed)
PA Pending medical coverage. Pharmacy benefits not covered.

## 2023-07-10 ENCOUNTER — Telehealth: Payer: Self-pay

## 2023-07-10 NOTE — Telephone Encounter (Signed)
Per Patient, Patient checking to see if the medical covers the botox if now she thinks it will cover [facility]

## 2023-07-10 NOTE — Telephone Encounter (Signed)
Additional information needed by plan. Updated PA and resubmitted via fax.

## 2023-07-14 NOTE — Telephone Encounter (Signed)
Pharmacy Patient Advocate Encounter- Botox BIV-Medical Benefit:  Buy/Bill J code: [id number]  PA was submitted to [employer] and has been approved through: 8.16.24 - 8.15.25 Authorization# [id number]  (Not covered under pharmacy benefits) Buy/Bill

## 2023-07-15 NOTE — Telephone Encounter (Signed)
Patient advised of Buy and Bill. Will contact insurance to see how much that may cost.

## 2023-07-23 ENCOUNTER — Other Ambulatory Visit (HOSPITAL_COMMUNITY): Payer: Self-pay

## 2023-07-23 ENCOUNTER — Telehealth: Payer: Self-pay

## 2023-07-23 NOTE — Telephone Encounter (Signed)
Per patient, Erin Anderson not primary payer. Her Medicaid [employer] is.  Per Patient please recheck but we have to wait at lease a week or two. Or patient will contact us to let us know.

## 2023-07-23 NOTE — Telephone Encounter (Signed)
Are we waiting for the update to occur?

## 2023-08-06 ENCOUNTER — Other Ambulatory Visit (HOSPITAL_COMMUNITY): Payer: Self-pay

## 2023-08-06 NOTE — Telephone Encounter (Signed)
LMOVM if please call to let the office know if we need to reschedule her Botox appt to 09/04/23.

## 2023-08-06 NOTE — Telephone Encounter (Signed)
LMOVM for patient to call the office.

## 2023-08-14 ENCOUNTER — Ambulatory Visit: Payer: Commercial Managed Care - HMO | Admitting: Neurology

## 2023-08-20 ENCOUNTER — Telehealth: Payer: Self-pay

## 2023-08-20 NOTE — Telephone Encounter (Signed)
Letter received 07/28/23 [employer]:  PA Botox 200 units approved 08/17/23-02/13/24.   PA team are you able to tell me what pharmacy she can use. Appt scheduled for 09/04/23

## 2023-08-24 ENCOUNTER — Other Ambulatory Visit (HOSPITAL_COMMUNITY): Payer: Self-pay

## 2023-08-24 ENCOUNTER — Other Ambulatory Visit: Payer: Self-pay

## 2023-08-24 MED ORDER — ONABOTULINUMTOXINA 200 UNITS IJ SOLR
INTRAMUSCULAR | 4 refills | Status: DC
  Filled 2023-08-24: qty 1, fill #0
  Filled 2023-08-27 (×2): qty 1, 84d supply, fill #0

## 2023-08-24 NOTE — Telephone Encounter (Signed)
Patient advised of note from PA team , Patient able to fill at [facility] for $4.00.    Script sent to [facility].  Appt scheduled for 10/11 at 10:50 am

## 2023-08-27 ENCOUNTER — Other Ambulatory Visit (HOSPITAL_COMMUNITY): Payer: Self-pay

## 2023-08-27 ENCOUNTER — Other Ambulatory Visit: Payer: Self-pay

## 2023-08-27 NOTE — Progress Notes (Signed)
Specialty Pharmacy Initial Fill Coordination Note  Erin Anderson is a 62 y.o. female contacted today regarding refills of specialty medication(s) Onabotulinumtoxina   Patient requested Courier to Provider Office   Delivery date: 09/01/23   Verified address: [address]  [address], , [place], [address]   Medication will be filled on 10.7.24.   Patient is aware of $4 copayment.

## 2023-08-31 ENCOUNTER — Other Ambulatory Visit: Payer: Self-pay

## 2023-08-31 ENCOUNTER — Telehealth: Payer: Self-pay | Admitting: Neurology

## 2023-08-31 NOTE — Telephone Encounter (Signed)
Patient called with questions on a PA for Medication

## 2023-09-01 MED ORDER — UBRELVY 100 MG PO TABS: 100.0000 mg | ORAL_TABLET | ORAL | 5 refills | Status: DC | PRN

## 2023-09-01 NOTE — Telephone Encounter (Signed)
Patient requesting Bernita Raisin to

## 2023-09-01 NOTE — Telephone Encounter (Signed)
Pt called in and left a message returning [facility] call. She stated she needs prior auth for a medication and is wanting to see if we have samples until the PA is obtained.

## 2023-09-02 NOTE — Telephone Encounter (Signed)
Patient advised of Samples.   Patient will come by in the morning to get samples

## 2023-09-02 NOTE — Telephone Encounter (Signed)
Pt called in and left a message. She is trying to speak with [facility] about her PA and samples

## 2023-09-04 ENCOUNTER — Ambulatory Visit: Payer: Medicaid Other | Admitting: Neurology

## 2023-09-04 DIAGNOSIS — G43711 Chronic migraine without aura, intractable, with status migrainosus: Secondary | ICD-10-CM

## 2023-09-04 MED ORDER — ONABOTULINUMTOXINA 100 UNITS IJ SOLR
200.0000 [IU] | Freq: Once | INTRAMUSCULAR | Status: AC
  Administered 2023-09-04: 155 [IU] via INTRAMUSCULAR

## 2023-09-04 NOTE — Progress Notes (Signed)

## 2023-09-11 ENCOUNTER — Telehealth: Payer: Self-pay | Admitting: Neurology

## 2023-09-11 ENCOUNTER — Telehealth: Payer: Self-pay | Admitting: Pharmacy Technician

## 2023-09-11 ENCOUNTER — Other Ambulatory Visit (HOSPITAL_COMMUNITY): Payer: Self-pay

## 2023-09-11 NOTE — Telephone Encounter (Signed)
Patient is calling about the PA for Ubrelvy, Patient is hurting really bad and is having to take OTC headache medication . Patient is requesting something that wouldn't need a PA .

## 2023-09-11 NOTE — Telephone Encounter (Signed)
Patient advised of Dr.Jaffe note, The only thing I can prescribe that likely wouldn't need a prior authorization would be a triptan, which she told me that she cannot tolerate.  She can come by the office and pick up some more samples of Ubrelvy while we wait for decision regarding the PA.  Otherwise, she will need to be open to trying another triptan.  If she would like to try a triptan, please send prescription for rizatriptan 10mg  - take 1 tablet as needed.  May repeat in 2 hours.  Maximum 2 tablets in 24 hours.

## 2023-09-11 NOTE — Telephone Encounter (Signed)
Pharmacy Patient Advocate Encounter  Received notification from [facility] that Prior Authorization for UBRELVY 100MG  has been DENIED.  See denial reason below. No denial letter attached in CMM. Will attache denial letter to Media tab once received.   PA #/Case ID/Reference #: [id number]

## 2023-09-11 NOTE — Telephone Encounter (Signed)
Pharmacy Patient Advocate Encounter   Received notification from Physician's Office that prior authorization for UBRELVY 100MG  is required/requested.   Insurance verification completed.   The patient is insured through [facility] .   Per test claim: PA required; PA submitted to [facility] via CoverMyMeds Key/confirmation #/EOC [id number] Status is pending

## 2023-09-23 NOTE — Telephone Encounter (Signed)
Appeals [facility] routed into encounter

## 2023-09-29 NOTE — Telephone Encounter (Signed)
Appeal has been submitted. Will advise when response is received or follow up in 1 week.   Dellie Burns, PharmD Clinical Pharmacist [place]  Direct Dial: [phone]

## 2023-09-30 ENCOUNTER — Other Ambulatory Visit: Payer: Self-pay

## 2023-10-01 NOTE — Telephone Encounter (Signed)
Patient advised.

## 2023-10-06 NOTE — Progress Notes (Unsigned)
New Patient Note  RE: Erin Anderson MRN: [id number] DOB: 11-17-1961 Date of Office Visit: 10/07/2023  Consult requested by: Porfirio Oar, PA Primary care provider: Sherren Mocha, MD  Chief Complaint: No chief complaint on file.  History of Present Illness: I had the pleasure of seeing Erin Anderson for [facility]  on 10/06/2023. She is a 62 y.o. female, who is referred here by Sherren Mocha, MD for the evaluation of allergic rhinitis and cough.  Discussed the use of AI scribe software for clinical note transcription with the patient, who gave verbal consent to proceed.  History of Present Illness             She reports symptoms of ***. Symptoms have been going on for *** years. The symptoms are present *** all year around with worsening in ***. Other triggers include exposure to ***. Anosmia: ***. Headache: ***. She has used *** with ***fair improvement in symptoms. Sinus infections: ***. Previous work up includes: ***. Previous ENT evaluation: ***. Previous sinus imaging: ***. History of nasal polyps: ***. Last eye exam: ***. History of reflux: ***. She reports symptoms of *** chest tightness, shortness of breath, coughing, wheezing, nocturnal awakenings for *** years. Current medications include *** which help. She reports *** using aerochamber with inhalers. She tried the following inhalers: ***. Main triggers are ***allergies, infections, weather changes, smoke, exercise, pet exposure. In the last month, frequency of symptoms: ***x/week. Frequency of nocturnal symptoms: ***x/month. Frequency of SABA use: ***x/week. Interference with physical activity: ***. Sleep is ***disturbed. In the last 12 months, emergency room visits/urgent care visits/doctor office visits or hospitalizations due to respiratory issues: ***. In the last 12 months, oral steroids courses: ***. Lifetime history of hospitalization for respiratory issues: ***. Prior  intubations: ***. Asthma was diagnosed at age *** by ***. History of pneumonia: ***. She was evaluated by allergist ***pulmonologist in the past. Smoking exposure: ***. Up to date with flu vaccine: ***. Up to date with pneumonia vaccine: ***. Up to date with COVID-19 vaccine: ***. Prior Covid-19 infection: ***. History of reflux: ***.  Assessment and Plan: [name] is a 62 y.o. female with: ***  Assessment and Plan               No follow-ups on file.  No orders of the defined types were placed in this encounter.  Lab Orders  No laboratory test(s) ordered today    Other allergy screening: Asthma: {Blank single:19197::"yes","no"} Rhino conjunctivitis: {Blank single:19197::"yes","no"} Food allergy: {Blank single:19197::"yes","no"} Medication allergy: {Blank single:19197::"yes","no"} Hymenoptera allergy: {Blank single:19197::"yes","no"} Urticaria: {Blank single:19197::"yes","no"} Eczema:{Blank single:19197::"yes","no"} History of recurrent infections suggestive of immunodeficency: {Blank single:19197::"yes","no"}  Diagnostics: Spirometry:  Tracings reviewed. Her effort: {Blank single:19197::"Good reproducible efforts.","It was hard to get consistent efforts and there is a question as to whether this reflects a maximal maneuver.","Poor effort, data can not be interpreted."} FVC: ***L FEV1: ***L, ***% predicted FEV1/FVC ratio: ***% Interpretation: {Blank single:19197::"Spirometry consistent with mild obstructive disease","Spirometry consistent with moderate obstructive disease","Spirometry consistent with severe obstructive disease","Spirometry consistent with possible restrictive disease","Spirometry consistent with mixed obstructive and restrictive disease","Spirometry uninterpretable due to technique","Spirometry consistent with normal pattern","No overt abnormalities noted given today's efforts"}.  Please see scanned spirometry results for details.  Skin Testing: {Blank  single:19197::"Select foods","Environmental allergy panel","Environmental allergy panel and select foods","Food allergy panel","None","Deferred due to recent antihistamines use"}. *** Results discussed with patient/family.   Past Medical History: Patient Active Problem List   Diagnosis Date Noted  .  Chronic migraine without aura, with intractable migraine, so stated, with status migrainosus 07/06/2018  . Abnormal finding on mammography 04/01/2018  . Disorder of vulva 04/01/2018  . Menopausal symptom 04/01/2018  . SUI (stress urinary incontinence, female) 04/01/2018  . Headache 01/30/2013  . Essential hypertension 12/06/2008  . Hypothyroidism 02/16/2008  . Anxiety state 08/25/2007  . GOITER, MULTINODULAR 08/24/2007  . Insomnia 08/24/2007   Past Medical History:  Diagnosis Date  . Allergy   . Anxiety   . Depression   . GERD (gastroesophageal reflux disease)   . Herpes zoster   . Migraines   . Ovarian cyst    recurrent  . SUI (stress urinary incontinence, female) 04/01/2018  . Thyroid disease    cysts   Past Surgical History: Past Surgical History:  Procedure Laterality Date  . CESAREAN SECTION  1988  . COLONOSCOPY  2008  . ENDOMETRIAL ABLATION  2012  . TONSILLECTOMY AND ADENOIDECTOMY  1963   Medication List:  Current Outpatient Medications  Medication Sig Dispense Refill  . AJOVY 225 MG/1.5ML SOSY Inject 1 Syringe into the skin every 30 (thirty) days. (Patient not taking: Reported on 06/22/2023)    . botulinum toxin Type A (BOTOX) 200 units injection Inject 155 units IM into multiple site in the face,neck and head once every 90 days 1 each 4  . busPIRone (BUSPAR) 15 MG tablet Take 15 mg by mouth 2 (two) times daily.    . cetirizine (ZYRTEC) 10 MG tablet Take 1 tablet (10 mg total) by mouth at bedtime. 90 tablet 1  . diclofenac sodium (VOLTAREN) 1 % GEL Apply 4 g topically 4 (four) times daily. (Patient taking differently: Apply 4 g topically 4 (four) times daily as  needed.) 100 g 2  . Estradiol-Norethindrone Acet 0.5-0.1 MG tablet Take 1 tablet by mouth daily.    Marland Kitchen FIBER ADULT GUMMIES PO Take by mouth.    . fluticasone (FLONASE) 50 MCG/ACT nasal spray SHAKE LIQUID AND USE 2 SPRAYS IN EACH NOSTRIL EVERY DAY AT BEDTIME 16 g 2  . levothyroxine (SYNTHROID, LEVOTHROID) 88 MCG tablet Take 1 tablet (88 mcg total) by mouth daily before breakfast. 90 tablet 1  . LORazepam (ATIVAN) 0.5 MG tablet TAKE 1 TABLET BY MOUTH EVERY 8 HOURS IF NEEDED FOR ANXIETY 60 tablet 0  . omeprazole (PRILOSEC) 40 MG capsule Take 40 mg by mouth BID x 10 weeks, then reduce to qd 140 capsule 0  . omeprazole (PRILOSEC) 40 MG capsule Take 1 capsule (40 mg total) by mouth daily. 90 capsule 3  . ondansetron (ZOFRAN) 4 MG tablet Take 1 tablet (4 mg total) by mouth every 8 (eight) hours as needed. 20 tablet 5  . Rimegepant Sulfate (NURTEC) 75 MG TBDP Take 1 tablet by mouth as needed. (Patient not taking: Reported on 06/22/2023)    . tizanidine (ZANAFLEX) 6 MG capsule Take 1 capsule (6 mg total) by mouth at bedtime. (Patient taking differently: Take 6 mg by mouth at bedtime as needed.) 30 capsule 0  . traMADol (ULTRAM) 50 MG tablet take 1 tablet by mouth every 8 hours if needed 60 tablet 0  . Ubrogepant (UBRELVY) 100 MG TABS Take 1 tablet (100 mg total) by mouth as needed (take 1 tab at the earlist onset of Migraine. May repeat 2 in hours. Max 2 tabs in 24 hours). 16 tablet 5  . valsartan-hydrochlorothiazide (DIOVAN-HCT) 160-25 MG tablet Take 1 tablet by mouth daily.    Marland Kitchen venlafaxine XR (EFFEXOR-XR) 37.5 MG 24 hr capsule Take  37.5 mg by mouth daily.     No current facility-administered medications for this visit.   Allergies: Allergies  Allergen Reactions  . Sulfa Antibiotics   . Sulfonamide Derivatives   . Meloxicam Rash   Social History: Social History   Socioeconomic History  . Marital status: Divorced    Spouse name: Not on file  . Number of children: 3  . Years of education: 65  .  Highest education level: Some college, no degree  Occupational History  . Occupation: patient account rep    Employer: [employer]  Tobacco Use  . Smoking status: Former    Current packs/day: 0.00    Average packs/day: 1 pack/day for 10.0 years (10.0 ttl pk-yrs)    Types: Cigarettes    Start date: 01/01/1976    Quit date: 12/31/1985    Years since quitting: 37.7  . Smokeless tobacco: Never  Vaping Use  . Vaping status: Never Used  Substance and Sexual Activity  . Alcohol use: Yes    Comment: 1-2 weekly  . Drug use: No  . Sexual activity: Yes    Partners: Male    Birth control/protection: None  Other Topics Concern  . Not on file  Social History Narrative   Divorced. Education: Automotive engineer (some). Exercises: 3 times a week, walk and yoga.   Social Determinants of Health   Financial Resource Strain: Low Risk  (09/28/2023)   Received from [facility]   Overall Financial Resource Strain (CARDIA)   . Difficulty of Paying Living Expenses: Not hard at all  Food Insecurity: No Food Insecurity (09/28/2023)   Received from [facility]   Hunger Vital Sign   . Worried About Programme researcher, broadcasting/film/video in the Last Year: Never true   . Ran Out of Food in the Last Year: Never true  Transportation Needs: No Transportation Needs (09/28/2023)   [facility] - Transportation   . Lack of Transportation (Medical): No   . Lack of Transportation (Non-Medical): No  Physical Activity: Insufficiently Active (09/28/2023)   [facility] from [facility]   Exercise Vital Sign   . Days of Exercise per Week: 4 days   . Minutes of Exercise per Session: 30 min  Stress: Stress Concern Present (09/28/2023)   Received from [facility] - Occupational Stress Questionnaire   . Feeling of Stress : Rather much  Social Connections: Somewhat Isolated (09/28/2023)   Received from [facility]   Social Network   . How would you rate your social  network (family, work, friends)?: Restricted participation with some degree of social isolation   Lives in a ***. Smoking: *** Occupation: ***  Environmental HistorySurveyor, minerals in the house: Copywriter, advertising in the family room: {Blank single:19197::"yes","no"} Carpet in the bedroom: {Blank single:19197::"yes","no"} Heating: {Blank single:19197::"electric","gas","heat pump"} Cooling: {Blank single:19197::"central","window","heat pump"} Pet: {Blank single:19197::"yes ***","no"}  Family History: Family History  Problem Relation Age of Onset  . Alcohol abuse Father        died at age 69 from alcohol related issues  . Alzheimer's disease Maternal Grandmother   . Brain cancer Maternal Grandfather   . Migraines Maternal Grandfather   . Migraines Cousin   . Colon cancer Neg Hx   . Esophageal cancer Neg Hx   . Pancreatic cancer Neg Hx   . Stomach cancer Neg Hx   . Colon polyps Neg Hx   . Rectal cancer Neg Hx    Problem  Relation Asthma                                   *** Eczema                                *** Food allergy                          *** Allergic rhino conjunctivitis     ***  Review of Systems  Constitutional:  Negative for appetite change, chills, fever and unexpected weight change.  HENT:  Negative for congestion and rhinorrhea.   Eyes:  Negative for itching.  Respiratory:  Negative for cough, chest tightness, shortness of breath and wheezing.   Cardiovascular:  Negative for chest pain.  Gastrointestinal:  Negative for abdominal pain.  Genitourinary:  Negative for difficulty urinating.  Skin:  Negative for rash.  Neurological:  Negative for headaches.   Objective: There were no vitals taken for this visit. There is no height or weight on file to calculate BMI. Physical Exam Vitals and nursing note reviewed.  Constitutional:      Appearance: Normal appearance. She is well-developed.  HENT:      Head: Normocephalic and atraumatic.     Right Ear: Tympanic membrane and external ear normal.     Left Ear: Tympanic membrane and external ear normal.     Nose: Nose normal.     Mouth/Throat:     Mouth: Mucous membranes are moist.     Pharynx: Oropharynx is clear.  Eyes:     Conjunctiva/sclera: Conjunctivae normal.  Cardiovascular:     Rate and Rhythm: Normal rate and regular rhythm.     Heart sounds: Normal heart sounds. No murmur heard.    No friction rub. No gallop.  Pulmonary:     Effort: Pulmonary effort is normal.     Breath sounds: Normal breath sounds. No wheezing, rhonchi or rales.  Musculoskeletal:     Cervical back: Neck supple.  Skin:    General: Skin is warm.     Findings: No rash.  Neurological:     Mental Status: She is alert and oriented to person, place, and time.  Psychiatric:        Behavior: Behavior normal.  The plan was reviewed with the patient/family, and all questions/concerned were addressed.  It was my pleasure to see Avaiyah today and participate in her care. Please feel free to contact me with any questions or concerns.  Sincerely,  Wyline Mood, DO Allergy & Immunology  [facility] office: [phone] [facility] office: [phone]

## 2023-10-07 ENCOUNTER — Ambulatory Visit (INDEPENDENT_AMBULATORY_CARE_PROVIDER_SITE_OTHER): Payer: Medicaid Other | Admitting: Allergy

## 2023-10-07 ENCOUNTER — Encounter: Payer: Self-pay | Admitting: Allergy

## 2023-10-07 VITALS — BP 142/78 | HR 76 | Temp 98.1°F | Resp 12 | Ht 63.0 in | Wt 141.7 lb

## 2023-10-07 DIAGNOSIS — J3089 Other allergic rhinitis: Secondary | ICD-10-CM | POA: Diagnosis not present

## 2023-10-07 DIAGNOSIS — K219 Gastro-esophageal reflux disease without esophagitis: Secondary | ICD-10-CM | POA: Diagnosis not present

## 2023-10-07 NOTE — Patient Instructions (Addendum)
Rhinitis  Return for environmental allergy skin testing. Will make additional recommendations based on results. If significant positives will recommend allergy injections - handout given. Make sure you don't take any antihistamines for 3 days before the skin testing appointment. Don't put any lotion on the back and arms on the day of testing.  Plan on being here for 30-60 minutes.   Reflux See handout for lifestyle and dietary modifications. Continue omeprazole 40mg  once day - nothing to eat or drink for 20-30 minutes afterwards.   Follow up for skin testing.

## 2023-10-09 ENCOUNTER — Other Ambulatory Visit (HOSPITAL_COMMUNITY): Payer: Self-pay

## 2023-10-11 NOTE — Progress Notes (Unsigned)
   Skin testing note  RE: Erin Anderson MRN: [id number] DOB: 1961/09/20 Date of Office Visit: 10/12/2023  Referring provider: Sherren Mocha, MD Primary care provider: Rebecka Apley, NP  Chief Complaint: No chief complaint on file.  History of Present Illness: I had the pleasure of seeing [name] for a skin testing visit at the [facility] [place] on 10/11/2023. She is a 62 y.o. female, who is being followed for allergic rhinitis and GERD. Her previous allergy office visit was on 10/07/2023 with Dr. Selena Batten. Today is a skin testing visit.   Discussed the use of AI scribe software for clinical note transcription with the patient, who gave verbal consent to proceed.  History of Present Illness             Other allergic rhinitis Chronic symptoms of nasal congestion, itching, sneezing, and ocular pruritus. Symptoms are worse in the fall and spring. Currently on Zyrtec and Flonase with occasional use of Benadryl. Return for environmental allergy skin testing. Will make additional recommendations based on results. If significant positives will recommend allergy injections - handout given. Make sure you don't take any antihistamines for 3 days before the skin testing appointment. Don't put any lotion on the back and arms on the day of testing.  Plan on being here for 30-60 minutes.    Gastroesophageal reflux disease, unspecified whether esophagitis present Chronic symptoms, recently started on Omeprazole 40mg  at night with some improvement. See handout for lifestyle and dietary modifications. Continue omeprazole 40mg  once day - nothing to eat or drink for 20-30 minutes afterwards.  Assessment and Plan: Erin Anderson is a 62 y.o. female with: ***  Assessment and Plan              No follow-ups on file.  No orders of the defined types were placed in this encounter.  Lab Orders  No laboratory test(s) ordered today    Diagnostics: Spirometry:  Tracings  reviewed. Her effort: {Blank single:19197::"Good reproducible efforts.","It was hard to get consistent efforts and there is a question as to whether this reflects a maximal maneuver.","Poor effort, data can not be interpreted."} FVC: ***L FEV1: ***L, ***% predicted FEV1/FVC ratio: ***% Interpretation: {Blank single:19197::"Spirometry consistent with mild obstructive disease","Spirometry consistent with moderate obstructive disease","Spirometry consistent with severe obstructive disease","Spirometry consistent with possible restrictive disease","Spirometry consistent with mixed obstructive and restrictive disease","Spirometry uninterpretable due to technique","Spirometry consistent with normal pattern","No overt abnormalities noted given today's efforts"}.  Please see scanned spirometry results for details.  Skin Testing: {Blank single:19197::"Select foods","Environmental allergy panel","Environmental allergy panel and select foods","Food allergy panel","None","Deferred due to recent antihistamines use"}. *** Results discussed with patient/family.   Previous notes and tests were reviewed. The plan was reviewed with the patient/family, and all questions/concerned were addressed.  It was my pleasure to see Erin Anderson today and participate in her care. Please feel free to contact me with any questions or concerns.  Sincerely,  Wyline Mood, DO Allergy & Immunology  [facility] office: [phone] [facility] office: [phone]

## 2023-10-12 ENCOUNTER — Ambulatory Visit: Payer: Medicaid Other | Admitting: Allergy

## 2023-10-12 ENCOUNTER — Other Ambulatory Visit (HOSPITAL_COMMUNITY): Payer: Self-pay

## 2023-10-12 ENCOUNTER — Encounter: Payer: Self-pay | Admitting: Allergy

## 2023-10-12 DIAGNOSIS — K219 Gastro-esophageal reflux disease without esophagitis: Secondary | ICD-10-CM

## 2023-10-12 DIAGNOSIS — J3089 Other allergic rhinitis: Secondary | ICD-10-CM | POA: Diagnosis not present

## 2023-10-12 MED ORDER — MONTELUKAST SODIUM 10 MG PO TABS
10.0000 mg | ORAL_TABLET | Freq: Every day | ORAL | 5 refills | Status: DC
  Filled 2023-10-12: qty 30, 30d supply, fill #0
  Filled 2023-11-11: qty 30, 30d supply, fill #1
  Filled 2023-12-11: qty 30, 30d supply, fill #2
  Filled 2024-01-13: qty 30, 30d supply, fill #3
  Filled 2024-02-13: qty 30, 30d supply, fill #4
  Filled 2024-03-14: qty 30, 30d supply, fill #5

## 2023-10-12 NOTE — Patient Instructions (Addendum)
Today's skin testing positive to weed, trees, dust mites. Results given.  Environmental allergies Start environmental control measures as below. Use over the counter antihistamines such as Zyrtec (cetirizine), Claritin (loratadine), Allegra (fexofenadine), or Xyzal (levocetirizine) daily as needed. May take twice a day during allergy flares. May switch antihistamines every few months. Use Flonase (fluticasone) nasal spray 1-2 sprays per nostril once a day as needed for nasal congestion.  Nasal saline spray (i.e., Simply Saline) or nasal saline lavage (i.e., NeilMed) is recommended as needed and prior to medicated nasal sprays. Start Singulair (montelukast) 10mg  daily at night. Cautioned that in some children/adults can experience behavioral changes including hyperactivity, agitation, depression, sleep disturbances and suicidal ideations. These side effects are rare, but if you notice them you should notify me and discontinue Singulair (montelukast). Consider allergy injections for long term control if above medications do not help the symptoms - handout given.  1 shot.   Reflux Continue lifestyle and dietary modifications. Continue omeprazole 40mg  once day - nothing to eat or drink for 20-30 minutes afterwards.   Return in about 2 months (around 12/12/2023). Or sooner if needed.   Reducing Pollen Exposure Pollen seasons: trees (spring), grass (summer) and ragweed/weeds (fall). Keep windows closed in your home and car to lower pollen exposure.  Install air conditioning in the bedroom and throughout the house if possible.  Avoid going out in dry windy days - especially early morning. Pollen counts are highest between 5 - 10 AM and on dry, hot and windy days.  Save outside activities for late afternoon or after a heavy rain, when pollen levels are lower.  Avoid mowing of grass if you have grass pollen allergy. Be aware that pollen can also be transported indoors on people and pets.  Dry your  clothes in an automatic dryer rather than hanging them outside where they might collect pollen.  Rinse hair and eyes before bedtime.  Control of House Dust Mite Allergen Dust mite allergens are a common trigger of allergy and asthma symptoms. While they can be found throughout the house, these microscopic creatures thrive in warm, humid environments such as bedding, upholstered furniture and carpeting. Because so much time is spent in the bedroom, it is essential to reduce mite levels there.  Encase pillows, mattresses, and box springs in special allergen-proof fabric covers or airtight, zippered plastic covers.  Bedding should be washed weekly in hot water (130 F) and dried in a hot dryer. Allergen-proof covers are available for comforters and pillows that can't be regularly washed.  Wash the allergy-proof covers every few months. Minimize clutter in the bedroom. Keep pets out of the bedroom.  Keep humidity less than 50% by using a dehumidifier or air conditioning. You can buy a humidity measuring device called a hygrometer to monitor this.  If possible, replace carpets with hardwood, linoleum, or washable area rugs. If that's not possible, vacuum frequently with a vacuum that has a HEPA filter. Remove all upholstered furniture and non-washable window drapes from the bedroom. Remove all non-washable stuffed toys from the bedroom.  Wash stuffed toys weekly.

## 2023-10-13 ENCOUNTER — Other Ambulatory Visit (HOSPITAL_COMMUNITY): Payer: Self-pay

## 2023-10-26 ENCOUNTER — Other Ambulatory Visit (HOSPITAL_COMMUNITY): Payer: Self-pay

## 2023-11-02 ENCOUNTER — Telehealth: Payer: Self-pay | Admitting: Neurology

## 2023-11-02 NOTE — Telephone Encounter (Signed)
Patient advised per [facility] patient may stop by and get samples of ubrelvy.

## 2023-11-02 NOTE — Telephone Encounter (Signed)
Left message on the VM stating that she needs to speak with [facility] about medication, sh ehas some questions. She has had a migraine for the last couple of days

## 2023-11-02 NOTE — Telephone Encounter (Signed)
Patient wants to know if she can have until we get the appeal back.   Please advise.

## 2023-11-03 NOTE — Telephone Encounter (Signed)
Medication Samples have been provided to the patient.  Drug name:ubrelvy      Strength: 100 mg        Qty: 4  LOT: [id number]  Exp.Date: 1/26  Dosing instructions: as needed  The patient has been instructed regarding the correct time, dose, and frequency of taking this medication, including desired effects and most common side effects.   Leida Lauth 8:24 AM 11/03/2023

## 2023-11-05 NOTE — Telephone Encounter (Signed)
Called today to follow up on the appeal for Ubrelvy.  They said it can take 30 to 60 days.  I have sent in another request asking that it be expedited.

## 2023-11-16 ENCOUNTER — Other Ambulatory Visit (HOSPITAL_COMMUNITY): Payer: Self-pay

## 2023-11-26 ENCOUNTER — Other Ambulatory Visit (HOSPITAL_COMMUNITY): Payer: Self-pay

## 2023-11-30 ENCOUNTER — Ambulatory Visit: Payer: Commercial Managed Care - HMO | Admitting: Internal Medicine

## 2023-11-30 ENCOUNTER — Other Ambulatory Visit (HOSPITAL_COMMUNITY): Payer: Self-pay

## 2023-11-30 NOTE — Telephone Encounter (Signed)
 Pharmacy Patient Advocate Encounter  Received notification from [facility] that the appeal for Ubrelvy  has been APPROVED from 11/27/2023 to 11/26/2024. Ran test claim, Copay is $4.00. This test claim was processed through [facility]- copay amounts may vary at other pharmacies due to pharmacy/plan contracts, or as the patient moves through the different stages of their insurance plan.  Thank you, Devere Pandy, PharmD Clinical Pharmacist  [place]  Direct Dial: [phone]

## 2023-12-01 ENCOUNTER — Other Ambulatory Visit (HOSPITAL_COMMUNITY): Payer: Self-pay | Admitting: Pharmacy Technician

## 2023-12-01 ENCOUNTER — Other Ambulatory Visit (HOSPITAL_COMMUNITY): Payer: Self-pay

## 2023-12-01 NOTE — Progress Notes (Signed)
 Disenrolled; Patient dont want to refill Botox. Patient had more severe migraines. She will stop taking all together.

## 2023-12-06 ENCOUNTER — Other Ambulatory Visit (HOSPITAL_COMMUNITY): Payer: Self-pay

## 2023-12-09 ENCOUNTER — Other Ambulatory Visit (HOSPITAL_COMMUNITY): Payer: Self-pay

## 2023-12-09 ENCOUNTER — Telehealth: Payer: Self-pay

## 2023-12-09 NOTE — Telephone Encounter (Signed)
 Per leslie at [facility] patient ask to have the Botox  script cancelled due to

## 2023-12-11 ENCOUNTER — Other Ambulatory Visit (HOSPITAL_COMMUNITY): Payer: Self-pay

## 2023-12-11 ENCOUNTER — Ambulatory Visit: Payer: Medicaid Other | Admitting: Neurology

## 2023-12-11 MED ORDER — UBRELVY 100 MG PO TABS
100.0000 mg | ORAL_TABLET | Freq: Every day | ORAL | 3 refills | Status: DC | PRN
  Filled 2023-12-11 – 2024-03-11 (×3): qty 16, 16d supply, fill #0

## 2023-12-11 MED ORDER — AIMOVIG 140 MG/ML ~~LOC~~ SOAJ
140.0000 mg | SUBCUTANEOUS | 3 refills | Status: DC
  Filled 2023-12-11: qty 2, 60d supply, fill #0

## 2023-12-13 NOTE — Progress Notes (Deleted)
Follow Up Note  RE: Erin Anderson MRN: [id number] DOB: June 13, 1961 Date of Office Visit: 12/14/2023  Referring provider: Rebecka Apley, NP Primary care provider: Rebecka Apley, NP  Chief Complaint: No chief complaint on file.  History of Present Illness: I had the pleasure of seeing Erin Anderson for a follow up visit at the [facility] on 12/13/2023. She is a 63 y.o. female, who is being followed for allergic rhinitis and GERD. Her previous allergy office visit was on 10/12/2023 with Dr. Selena Batten. Today is a regular follow up visit.  Discussed the use of AI scribe software for clinical note transcription with the patient, who gave verbal consent to proceed.  History of Present Illness            ***  Assessment and Plan: Erin Anderson is a 63 y.o. female with: Seasonal allergic rhinitis due to pollen Allergic rhinitis due to dust mite Past history - Chronic symptoms of nasal congestion, itching, sneezing, and ocular pruritus. Symptoms are worse in the fall and spring. 2024 skin testing positive to weed, trees, dust mites. Start environmental control measures as below. Use over the counter antihistamines such as Zyrtec (cetirizine), Claritin (loratadine), Allegra (fexofenadine), or Xyzal (levocetirizine) daily as needed. May take twice a day during allergy flares. May switch antihistamines every few months. Use Flonase (fluticasone) nasal spray 1-2 sprays per nostril once a day as needed for nasal congestion.  Nasal saline spray (i.e., Simply Saline) or nasal saline lavage (i.e., NeilMed) is recommended as needed and prior to medicated nasal sprays. Start Singulair (montelukast) 10mg  daily at night. Cautioned that in some children/adults can experience behavioral changes including hyperactivity, agitation, depression, sleep disturbances and suicidal ideations. These side effects are rare, but if you notice them you should notify me and discontinue Singulair  (montelukast). Consider allergy injections for long term control if above medications do not help the symptoms - handout given.  1 shot.    Gastroesophageal reflux disease, unspecified whether esophagitis present Continue lifestyle and dietary modifications. Continue omeprazole 40mg  once day - nothing to eat or drink for 20-30 minutes afterwards.    Assessment and Plan              No follow-ups on file.  No orders of the defined types were placed in this encounter.  Lab Orders  No laboratory test(s) ordered today    Diagnostics: Spirometry:  Tracings reviewed. Her effort: {Blank single:19197::"Good reproducible efforts.","It was hard to get consistent efforts and there is a question as to whether this reflects a maximal maneuver.","Poor effort, data can not be interpreted."} FVC: ***L FEV1: ***L, ***% predicted FEV1/FVC ratio: ***% Interpretation: {Blank single:19197::"Spirometry consistent with mild obstructive disease","Spirometry consistent with moderate obstructive disease","Spirometry consistent with severe obstructive disease","Spirometry consistent with possible restrictive disease","Spirometry consistent with mixed obstructive and restrictive disease","Spirometry uninterpretable due to technique","Spirometry consistent with normal pattern","No overt abnormalities noted given today's efforts"}.  Please see scanned spirometry results for details.  Skin Testing: {Blank single:19197::"Select foods","Environmental allergy panel","Environmental allergy panel and select foods","Food allergy panel","None","Deferred due to recent antihistamines use"}. *** Results discussed with patient/family.   Medication List:  Current Outpatient Medications  Medication Sig Dispense Refill  . botulinum toxin Type A (BOTOX) 200 units injection Inject 155 units IM into multiple site in the face,neck and head once every 90 days 1 each 4  . busPIRone (BUSPAR) 15 MG tablet Take 15 mg by mouth 2  (two) times daily.    . butalbital-apap-caffeine-codeine (FIORICET  WITH CODEINE) 50-325-40-30 MG capsule Take 1 capsule by mouth every 4 (four) hours as needed for headache.    . cetirizine (ZYRTEC) 10 MG tablet Take 1 tablet (10 mg total) by mouth at bedtime. 90 tablet 1  . diclofenac sodium (VOLTAREN) 1 % GEL Apply 4 g topically 4 (four) times daily. (Patient taking differently: Apply 4 g topically 4 (four) times daily as needed.) 100 g 2  . Erenumab-aooe (AIMOVIG) 140 MG/ML SOAJ Inject 140 mg into the skin every 30 (thirty) days. 1.12 mL 3  . Estradiol-Norethindrone Acet 0.5-0.1 MG tablet Take 1 tablet by mouth daily.    Marland Kitchen FIBER ADULT GUMMIES PO Take by mouth.    . fluticasone (FLONASE) 50 MCG/ACT nasal spray SHAKE LIQUID AND USE 2 SPRAYS IN EACH NOSTRIL EVERY DAY AT BEDTIME 16 g 2  . gabapentin (NEURONTIN) 300 MG capsule Take 300 mg by mouth 3 (three) times daily.    Marland Kitchen levothyroxine (SYNTHROID, LEVOTHROID) 88 MCG tablet Take 1 tablet (88 mcg total) by mouth daily before breakfast. 90 tablet 1  . LORazepam (ATIVAN) 0.5 MG tablet TAKE 1 TABLET BY MOUTH EVERY 8 HOURS IF NEEDED FOR ANXIETY 60 tablet 0  . montelukast (SINGULAIR) 10 MG tablet Take 1 tablet (10 mg total) by mouth at bedtime. 30 tablet 5  . omeprazole (PRILOSEC) 40 MG capsule Take 1 capsule (40 mg total) by mouth daily. 90 capsule 3  . ondansetron (ZOFRAN) 4 MG tablet Take 1 tablet (4 mg total) by mouth every 8 (eight) hours as needed. 20 tablet 5  . Rimegepant Sulfate (NURTEC) 75 MG TBDP Take 1 tablet by mouth as needed.    . tizanidine (ZANAFLEX) 6 MG capsule Take 1 capsule (6 mg total) by mouth at bedtime. (Patient taking differently: Take 6 mg by mouth at bedtime as needed.) 30 capsule 0  . traMADol (ULTRAM) 50 MG tablet take 1 tablet by mouth every 8 hours if needed 60 tablet 0  . Ubrogepant (UBRELVY) 100 MG TABS Take 1 tablet (100 mg total) by mouth as needed (take 1 tab at the earlist onset of Migraine. May repeat 2 in hours. Max  2 tabs in 24 hours). (Patient not taking: Reported on 10/07/2023) 16 tablet 5  . Ubrogepant (UBRELVY) 100 MG TABS Take 1 tablet (100 mg total) by mouth daily as needed. 16 tablet 3  . valsartan-hydrochlorothiazide (DIOVAN-HCT) 160-25 MG tablet Take 1 tablet by mouth daily.    Marland Kitchen venlafaxine XR (EFFEXOR-XR) 37.5 MG 24 hr capsule Take 37.5 mg by mouth daily.     No current facility-administered medications for this visit.   Allergies: Allergies  Allergen Reactions  . Sulfa Antibiotics   . Sulfonamide Derivatives   . Meloxicam Rash   I reviewed her past medical history, social history, family history, and environmental history and no significant changes have been reported from her previous visit.  Review of Systems  Constitutional:  Negative for appetite change, chills, fever and unexpected weight change.  HENT:  Positive for congestion, postnasal drip and sneezing. Negative for rhinorrhea.   Eyes:  Negative for itching.  Respiratory:  Negative for cough, chest tightness, shortness of breath and wheezing.   Cardiovascular:  Negative for chest pain.  Gastrointestinal:  Negative for abdominal pain.  Genitourinary:  Negative for difficulty urinating.  Skin:  Negative for rash.  Neurological:  Positive for headaches.   Objective: There were no vitals taken for this visit. There is no height or weight on file to calculate BMI. Physical  Exam Vitals and nursing note reviewed.  Constitutional:      Appearance: Normal appearance. She is well-developed.  HENT:     Head: Normocephalic and atraumatic.     Right Ear: Tympanic membrane and external ear normal.     Left Ear: Tympanic membrane and external ear normal.     Nose: Nose normal.     Mouth/Throat:     Mouth: Mucous membranes are moist.     Pharynx: Oropharynx is clear.  Eyes:     Conjunctiva/sclera: Conjunctivae normal.  Cardiovascular:     Rate and Rhythm: Normal rate and regular rhythm.     Heart sounds: Normal heart sounds. No  murmur heard.    No friction rub. No gallop.  Pulmonary:     Effort: Pulmonary effort is normal.     Breath sounds: Normal breath sounds. No wheezing, rhonchi or rales.  Musculoskeletal:     Cervical back: Neck supple.  Skin:    General: Skin is warm.     Findings: No rash.  Neurological:     Mental Status: She is alert and oriented to person, place, and time.  Psychiatric:        Behavior: Behavior normal.  Previous notes and tests were reviewed. The plan was reviewed with the patient/family, and all questions/concerned were addressed.  It was my pleasure to see Erin Anderson today and participate in her care. Please feel free to contact me with any questions or concerns.  Sincerely,  Wyline Mood, DO Allergy & Immunology  [facility] office: [phone] [facility] office: [phone]

## 2023-12-14 ENCOUNTER — Ambulatory Visit: Payer: Medicaid Other | Admitting: Allergy

## 2023-12-14 DIAGNOSIS — J3089 Other allergic rhinitis: Secondary | ICD-10-CM

## 2023-12-14 DIAGNOSIS — K219 Gastro-esophageal reflux disease without esophagitis: Secondary | ICD-10-CM

## 2023-12-14 DIAGNOSIS — J301 Allergic rhinitis due to pollen: Secondary | ICD-10-CM

## 2023-12-16 ENCOUNTER — Other Ambulatory Visit (HOSPITAL_COMMUNITY): Payer: Self-pay

## 2023-12-19 ENCOUNTER — Other Ambulatory Visit (HOSPITAL_COMMUNITY): Payer: Self-pay

## 2023-12-24 ENCOUNTER — Telehealth: Payer: Self-pay | Admitting: Neurology

## 2023-12-24 ENCOUNTER — Other Ambulatory Visit: Payer: Self-pay | Admitting: Neurology

## 2023-12-24 ENCOUNTER — Other Ambulatory Visit (HOSPITAL_COMMUNITY): Payer: Self-pay

## 2023-12-24 MED ORDER — AIMOVIG 140 MG/ML ~~LOC~~ SOAJ
140.0000 mg | SUBCUTANEOUS | 5 refills | Status: DC
  Filled 2023-12-24: qty 1, 28d supply, fill #0
  Filled 2024-01-25: qty 1, 30d supply, fill #1
  Filled 2024-03-11: qty 1, 30d supply, fill #2
  Filled 2024-04-05: qty 1, 30d supply, fill #3
  Filled 2024-05-24: qty 1, 30d supply, fill #4
  Filled 2024-06-28 – 2024-07-02 (×2): qty 1, 30d supply, fill #5
  Filled 2024-08-03: qty 1, 30d supply, fill #6

## 2023-12-24 NOTE — Telephone Encounter (Signed)
LMOVM for patient per Dr.Jaffe, Prescription for Aimovig 140mg  sent to [facility]

## 2023-12-24 NOTE — Telephone Encounter (Signed)
She is not going to do the botox anymore. She wants to go back on the injections. Aimovig is what she wants to try again.

## 2023-12-24 NOTE — Telephone Encounter (Signed)
Per patient she went to another neurologist to see if she had any other opinions.  Per patient she would like to stay with Dr.Jaffe.

## 2023-12-30 ENCOUNTER — Other Ambulatory Visit (HOSPITAL_COMMUNITY): Payer: Self-pay

## 2023-12-31 ENCOUNTER — Telehealth: Payer: Self-pay | Admitting: Pharmacy Technician

## 2023-12-31 ENCOUNTER — Other Ambulatory Visit (HOSPITAL_COMMUNITY): Payer: Self-pay

## 2023-12-31 NOTE — Telephone Encounter (Signed)
 Pharmacy Patient Advocate Encounter  Received notification from [facility] that Prior Authorization for AIMOVIG  140MG  has been APPROVED from 2.6.25 to 5.7.25   PA #/Case ID/Reference #: [id number]

## 2023-12-31 NOTE — Telephone Encounter (Signed)
 Pharmacy Patient Advocate Encounter   Received notification from CoverMyMeds that prior authorization for AIMOVIG  140MG  is required/requested.   Insurance verification completed.   The patient is insured through [facility] .   Per test claim: PA required; PA submitted to above mentioned insurance via CoverMyMeds Key/confirmation #/EOC [id number] Status is pending

## 2024-01-01 ENCOUNTER — Other Ambulatory Visit (HOSPITAL_COMMUNITY): Payer: Self-pay

## 2024-01-28 ENCOUNTER — Other Ambulatory Visit: Payer: Self-pay

## 2024-02-16 ENCOUNTER — Ambulatory Visit: Payer: Medicaid Other | Admitting: Neurology

## 2024-03-03 ENCOUNTER — Other Ambulatory Visit (HOSPITAL_COMMUNITY): Payer: Self-pay

## 2024-03-03 ENCOUNTER — Telehealth: Payer: Self-pay | Admitting: Pharmacy Technician

## 2024-03-03 NOTE — Telephone Encounter (Signed)
 Pharmacy Patient Advocate Encounter   Received notification from CoverMyMeds that prior authorization for AIMOVIG 140MG  is required/requested.   Insurance verification completed.   The patient is insured through [facility] .   Per test claim: PA required; PA submitted to above mentioned insurance via CoverMyMeds Key/confirmation #/EOC [id number] Status is pending

## 2024-03-04 ENCOUNTER — Other Ambulatory Visit (HOSPITAL_COMMUNITY): Payer: Self-pay

## 2024-03-04 NOTE — Telephone Encounter (Signed)
 Pharmacy Patient Advocate Encounter  Received notification from [facility] that Prior Authorization for AIMOVIG 140MG  has been APPROVED from 4.10.25 to 4.10.26. Ran test claim, Copay is $4. This test claim was processed through [facility]- copay amounts may vary at other pharmacies due to pharmacy/plan contracts, or as the patient moves through the different stages of their insurance plan.   PA #/Case ID/Reference #: [id number]

## 2024-03-11 ENCOUNTER — Other Ambulatory Visit: Payer: Self-pay

## 2024-03-11 ENCOUNTER — Encounter: Payer: Self-pay | Admitting: Neurology

## 2024-03-11 ENCOUNTER — Other Ambulatory Visit (HOSPITAL_COMMUNITY): Payer: Self-pay

## 2024-03-13 ENCOUNTER — Other Ambulatory Visit: Payer: Self-pay | Admitting: Neurology

## 2024-03-13 MED ORDER — UBRELVY 100 MG PO TABS
100.0000 mg | ORAL_TABLET | Freq: Every day | ORAL | 3 refills | Status: DC | PRN
  Filled 2024-03-13: qty 16, 16d supply, fill #0
  Filled 2024-05-05: qty 16, 30d supply, fill #0
  Filled 2024-05-19: qty 16, 16d supply, fill #0

## 2024-03-14 ENCOUNTER — Other Ambulatory Visit (HOSPITAL_COMMUNITY): Payer: Self-pay

## 2024-04-05 ENCOUNTER — Other Ambulatory Visit (HOSPITAL_COMMUNITY): Payer: Self-pay

## 2024-04-15 ENCOUNTER — Other Ambulatory Visit: Payer: Self-pay | Admitting: Allergy

## 2024-04-15 ENCOUNTER — Other Ambulatory Visit (HOSPITAL_COMMUNITY): Payer: Self-pay

## 2024-04-15 MED ORDER — MONTELUKAST SODIUM 10 MG PO TABS
10.0000 mg | ORAL_TABLET | Freq: Every day | ORAL | 5 refills | Status: DC
  Filled 2024-04-15 – 2024-04-28 (×2): qty 30, 30d supply, fill #0
  Filled 2024-05-24: qty 30, 30d supply, fill #1
  Filled 2024-06-28 – 2024-07-02 (×2): qty 30, 30d supply, fill #2
  Filled 2024-08-03 – 2024-08-25 (×2): qty 30, 30d supply, fill #3
  Filled 2024-09-30: qty 30, 30d supply, fill #4
  Filled 2024-11-05: qty 30, 30d supply, fill #5

## 2024-04-25 ENCOUNTER — Other Ambulatory Visit (HOSPITAL_COMMUNITY): Payer: Self-pay

## 2024-04-28 ENCOUNTER — Other Ambulatory Visit (HOSPITAL_COMMUNITY): Payer: Self-pay

## 2024-05-05 ENCOUNTER — Other Ambulatory Visit: Payer: Self-pay

## 2024-05-05 ENCOUNTER — Other Ambulatory Visit (HOSPITAL_COMMUNITY): Payer: Self-pay

## 2024-05-16 ENCOUNTER — Other Ambulatory Visit (HOSPITAL_COMMUNITY): Payer: Self-pay

## 2024-05-18 ENCOUNTER — Other Ambulatory Visit (HOSPITAL_COMMUNITY): Payer: Self-pay

## 2024-05-18 MED ORDER — TRAMADOL HCL 50 MG PO TABS
50.0000 mg | ORAL_TABLET | Freq: Every day | ORAL | 0 refills | Status: AC | PRN
  Filled 2024-05-18: qty 30, 30d supply, fill #0

## 2024-05-19 ENCOUNTER — Other Ambulatory Visit (HOSPITAL_COMMUNITY): Payer: Self-pay

## 2024-05-24 ENCOUNTER — Other Ambulatory Visit (HOSPITAL_COMMUNITY): Payer: Self-pay

## 2024-05-30 ENCOUNTER — Other Ambulatory Visit (HOSPITAL_COMMUNITY): Payer: Self-pay

## 2024-06-03 ENCOUNTER — Encounter: Payer: Self-pay | Admitting: Allergy

## 2024-06-03 ENCOUNTER — Other Ambulatory Visit (HOSPITAL_COMMUNITY): Payer: Self-pay

## 2024-06-03 ENCOUNTER — Ambulatory Visit (INDEPENDENT_AMBULATORY_CARE_PROVIDER_SITE_OTHER): Admitting: Allergy

## 2024-06-03 ENCOUNTER — Other Ambulatory Visit: Payer: Self-pay

## 2024-06-03 VITALS — BP 132/84 | HR 65 | Temp 97.9°F | Resp 18 | Ht 63.58 in | Wt 136.8 lb

## 2024-06-03 DIAGNOSIS — J3089 Other allergic rhinitis: Secondary | ICD-10-CM

## 2024-06-03 DIAGNOSIS — J302 Other seasonal allergic rhinitis: Secondary | ICD-10-CM

## 2024-06-03 DIAGNOSIS — K219 Gastro-esophageal reflux disease without esophagitis: Secondary | ICD-10-CM | POA: Diagnosis not present

## 2024-06-03 MED ORDER — EPINEPHRINE 0.3 MG/0.3ML IJ SOAJ
0.3000 mg | INTRAMUSCULAR | 1 refills | Status: AC | PRN
  Filled 2024-06-03 – 2024-06-13 (×2): qty 2, 30d supply, fill #0

## 2024-06-03 MED ORDER — RYALTRIS 665-25 MCG/ACT NA SUSP: 1.0000 | Freq: Two times a day (BID) | NASAL | 5 refills | Status: DC

## 2024-06-03 NOTE — Progress Notes (Signed)
 Follow Up Note  RE: Erin Anderson MRN: [id number] DOB: 17-Oct-1961 Date of Office Visit: 06/03/2024  Referring provider: Baird Comer GAILS, NP Primary care provider: Baird Comer GAILS, NP  Chief Complaint: Allergic Rhinitis  (Pt would like to discuss the allergy  shots.)  History of Present Illness: I had the pleasure of seeing Erin Anderson for a follow up visit at the [facility] on 06/03/2024. She is a 63 y.o. female, who is being followed for allergic rhinitis and GERD. Her previous allergy  office visit was on 10/12/2023 with Dr. Luke. Today is a regular follow up visit.  Discussed the use of AI scribe software for clinical note transcription with the patient, who gave verbal consent to proceed.    She experiences itchiness in the eyes, throat discomfort, and sinus issues, particularly during spring and summer.  Current medications include Flonase  nasal spray, administered as two sprays per nostril once daily, and Singulair  taken daily. She has not been taking Zyrtec , Allegra, or Claritin but has been using over-the-counter eye drops, possibly Alaway, for eye symptoms.  She expresses frustration with ongoing medication use, particularly due to concurrent management of migraines, which have been problematic for approximately fifteen years.   Family history reveals a cousin who was also allergic to dust mites and successfully underwent allergy  injections, which resolved her symptoms within a year. Patient wants to start AIT.     Assessment and Plan: Erin Anderson is a 63 y.o. female with: Seasonal and perennial allergic rhinitis Past history - Chronic symptoms of nasal congestion, itching, sneezing, and ocular pruritus. Symptoms are worse in the fall and spring. 2024 skin testing positive to weed, trees, dust mites. Interim history - still symptomatic and wants to start AIT. Continue environmental control measures as below. Use over the counter antihistamines such as  Zyrtec  (cetirizine ), Claritin (loratadine), Allegra (fexofenadine), or Xyzal (levocetirizine) daily as needed. May take twice a day during allergy  flares. May switch antihistamines every few months. Start Ryaltris  (olopatadine + mometasone  nasal spray combination) 1-2 sprays per nostril twice a day.  This replaces your other nasal sprays. If this works well for you, then have pharmacy ship the medication to your home - prescription already sent in.  Nasal saline spray (i.e., Simply Saline) or nasal saline lavage (i.e., NeilMed) is recommended as needed and prior to medicated nasal sprays. Continue Singulair  (montelukast ) 10mg  daily at night. Start allergy  injections. 1 injection.  Had a detailed discussion with patient/family that clinical history is suggestive of allergic rhinitis, and may benefit from allergy  immunotherapy (AIT). Discussed in detail regarding the dosing, schedule, side effects (mild to moderate local allergic reaction and rarely systemic allergic reactions including anaphylaxis), and benefits (significant improvement in nasal symptoms, seasonal flares of asthma) of immunotherapy with the patient. There is significant time commitment involved with allergy  shots, which includes weekly immunotherapy injections for first 9-12 months and then biweekly to monthly injections for 3-5 years. Consent was signed.   Gastroesophageal reflux disease, unspecified whether esophagitis present Continue lifestyle and dietary modifications. Continue omeprazole  40mg  once day - nothing to eat or drink for 20-30 minutes afterwards.     Return in about 6 months (around 12/04/2024).  Meds ordered this encounter  Medications   Olopatadine-Mometasone  (RYALTRIS ) 665-25 MCG/ACT SUSP    Sig: Place 1-2 sprays into the nose in the morning and at bedtime.    Dispense:  29 g    Refill:  5   EPINEPHrine  0.3 mg/0.3 mL IJ SOAJ injection  Sig: Inject 0.3 mg into the muscle as needed for anaphylaxis.     Dispense:  2 each    Refill:  1    May dispense generic/Mylan/Teva brand.   Lab Orders  No laboratory test(s) ordered today    Diagnostics: None.   Medication List:  Current Outpatient Medications  Medication Sig Dispense Refill   butalbital -apap-caffeine -codeine (FIORICET WITH CODEINE) 50-325-40-30 MG capsule Take 1 capsule by mouth every 4 (four) hours as needed for headache.     EPINEPHrine  0.3 mg/0.3 mL IJ SOAJ injection Inject 0.3 mg into the muscle as needed for anaphylaxis. 2 each 1   Erenumab -aooe (AIMOVIG ) 140 MG/ML SOAJ Inject 140 mg into the skin every 28 (twenty-eight) days. 1.12 mL 5   Estradiol -Norethindrone Acet 0.5-0.1 MG tablet Take 1 tablet by mouth daily.     FIBER ADULT GUMMIES PO Take by mouth.     gabapentin (NEURONTIN) 300 MG capsule Take 300 mg by mouth 3 (three) times daily.     levothyroxine  (SYNTHROID , LEVOTHROID) 88 MCG tablet Take 1 tablet (88 mcg total) by mouth daily before breakfast. 90 tablet 1   LORazepam  (ATIVAN ) 0.5 MG tablet TAKE 1 TABLET BY MOUTH EVERY 8 HOURS IF NEEDED FOR ANXIETY 60 tablet 0   montelukast  (SINGULAIR ) 10 MG tablet Take 1 tablet (10 mg total) by mouth at bedtime. 30 tablet 5   Olopatadine-Mometasone  (RYALTRIS ) 665-25 MCG/ACT SUSP Place 1-2 sprays into the nose in the morning and at bedtime. 29 g 5   omeprazole  (PRILOSEC) 40 MG capsule Take 1 capsule (40 mg total) by mouth daily. 90 capsule 3   ondansetron  (ZOFRAN ) 4 MG tablet Take 1 tablet (4 mg total) by mouth every 8 (eight) hours as needed. 20 tablet 5   tizanidine  (ZANAFLEX ) 6 MG capsule Take 1 capsule (6 mg total) by mouth at bedtime. (Patient taking differently: Take 6 mg by mouth at bedtime as needed.) 30 capsule 0   traMADol  (ULTRAM ) 50 MG tablet take 1 tablet by mouth every 8 hours if needed 60 tablet 0   traMADol  (ULTRAM ) 50 MG tablet Take 1 tablet (50 mg total) by mouth daily as needed for pain. 30 tablet 0   Ubrogepant  (UBRELVY ) 100 MG TABS Take 1 tablet (100 mg total) by  mouth daily as needed. 16 tablet 3   valsartan-hydrochlorothiazide (DIOVAN-HCT) 160-25 MG tablet Take 1 tablet by mouth daily.     venlafaxine XR (EFFEXOR-XR) 37.5 MG 24 hr capsule Take 37.5 mg by mouth daily.     botulinum toxin Type A  (BOTOX ) 200 units injection Inject 155 units IM into multiple site in the face,neck and head once every 90 days (Patient not taking: Reported on 06/03/2024) 1 each 4   busPIRone (BUSPAR) 15 MG tablet Take 15 mg by mouth 2 (two) times daily. (Patient not taking: Reported on 06/03/2024)     cetirizine  (ZYRTEC ) 10 MG tablet Take 1 tablet (10 mg total) by mouth at bedtime. (Patient not taking: Reported on 06/03/2024) 90 tablet 1   diclofenac  sodium (VOLTAREN ) 1 % GEL Apply 4 g topically 4 (four) times daily. (Patient not taking: Reported on 06/03/2024) 100 g 2   Rimegepant Sulfate (NURTEC) 75 MG TBDP Take 1 tablet by mouth as needed. (Patient not taking: Reported on 06/03/2024)     No current facility-administered medications for this visit.   Allergies: Allergies  Allergen Reactions   Sulfa Antibiotics    Sulfonamide Derivatives    Meloxicam  Rash   I reviewed her past medical history,  social history, family history, and environmental history and no significant changes have been reported from her previous visit.  Review of Systems  Constitutional:  Negative for appetite change, chills, fever and unexpected weight change.  HENT:  Positive for congestion, postnasal drip and sneezing. Negative for rhinorrhea.   Eyes:  Negative for itching.  Respiratory:  Negative for cough, chest tightness, shortness of breath and wheezing.   Cardiovascular:  Negative for chest pain.  Gastrointestinal:  Negative for abdominal pain.  Genitourinary:  Negative for difficulty urinating.  Skin:  Negative for rash.  Allergic/Immunologic: Positive for environmental allergies.  Neurological:  Positive for headaches.    Objective: BP 132/84 (BP Location: Left Arm, Patient Position:  Sitting, Cuff Size: Normal)   Pulse 65   Temp 97.9 F (36.6 C) (Temporal)   Resp 18   Ht 5' 3.58 (1.615 m)   Wt 136 lb 12.8 oz (62.1 kg)   SpO2 98%   BMI 23.79 kg/m  Body mass index is 23.79 kg/m. Physical Exam Vitals and nursing note reviewed.  Constitutional:      Appearance: Normal appearance. She is well-developed.  HENT:     Head: Normocephalic and atraumatic.     Right Ear: Tympanic membrane and external ear normal.     Left Ear: Tympanic membrane and external ear normal.     Nose: Nose normal.     Mouth/Throat:     Mouth: Mucous membranes are moist.     Pharynx: Oropharynx is clear.  Eyes:     Conjunctiva/sclera: Conjunctivae normal.  Cardiovascular:     Rate and Rhythm: Normal rate and regular rhythm.     Heart sounds: Normal heart sounds. No murmur heard.    No friction rub. No gallop.  Pulmonary:     Effort: Pulmonary effort is normal.     Breath sounds: Normal breath sounds. No wheezing, rhonchi or rales.  Musculoskeletal:     Cervical back: Neck supple.  Skin:    General: Skin is warm.     Findings: No rash.  Neurological:     Mental Status: She is alert and oriented to person, place, and time.  Psychiatric:        Behavior: Behavior normal.    Previous notes and tests were reviewed. The plan was reviewed with the patient/family, and all questions/concerned were addressed.  It was my pleasure to see Erin Anderson today and participate in her care. Please feel free to contact me with any questions or concerns.  Sincerely,  Orlan Cramp, DO Allergy  & Immunology  [facility]  [facility] office: [phone] [facility] office: [phone]

## 2024-06-03 NOTE — Patient Instructions (Addendum)
 Environmental allergies 2024 skin testing positive to weed, trees, dust mites. Continue environmental control measures as below. Use over the counter antihistamines such as Zyrtec  (cetirizine ), Claritin (loratadine), Allegra (fexofenadine), or Xyzal (levocetirizine) daily as needed. May take twice a day during allergy  flares. May switch antihistamines every few months. Start Ryaltris  (olopatadine + mometasone  nasal spray combination) 1-2 sprays per nostril twice a day.  This replaces your other nasal sprays. If this works well for you, then have pharmacy ship the medication to your home - prescription already sent in.  Nasal saline spray (i.e., Simply Saline) or nasal saline lavage (i.e., NeilMed) is recommended as needed and prior to medicated nasal sprays. Continue Singulair  (montelukast ) 10mg  daily at night. Start allergy  injections. 1 injection.  Had a detailed discussion with patient/family that clinical history is suggestive of allergic rhinitis, and may benefit from allergy  immunotherapy (AIT). Discussed in detail regarding the dosing, schedule, side effects (mild to moderate local allergic reaction and rarely systemic allergic reactions including anaphylaxis), and benefits (significant improvement in nasal symptoms, seasonal flares of asthma) of immunotherapy with the patient. There is significant time commitment involved with allergy  shots, which includes weekly immunotherapy injections for first 9-12 months and then biweekly to monthly injections for 3-5 years. Consent was signed. I have prescribed epinephrine  injectable device and demonstrated proper use. For mild symptoms you can take over the counter antihistamines (zyrtec  10mg  to 20mg ) and monitor symptoms closely.  If symptoms worsen or if you have severe symptoms including breathing issues, throat closure, significant swelling, whole body hives, severe diarrhea and vomiting, lightheadedness then use epinephrine  and seek immediate medical  care afterwards. Emergency action plan given.  Reflux Continue lifestyle and dietary modifications. Continue omeprazole  40mg  once day - nothing to eat or drink for 20-30 minutes afterwards.   Return in about 6 months (around 12/04/2024). Or sooner if needed.  Make first shot appointment in 3 weeks.   Reducing Pollen Exposure Pollen seasons: trees (spring), grass (summer) and ragweed/weeds (fall). Keep windows closed in your home and car to lower pollen exposure.  Install air conditioning in the bedroom and throughout the house if possible.  Avoid going out in dry windy days - especially early morning. Pollen counts are highest between 5 - 10 AM and on dry, hot and windy days.  Save outside activities for late afternoon or after a heavy rain, when pollen levels are lower.  Avoid mowing of grass if you have grass pollen allergy . Be aware that pollen can also be transported indoors on people and pets.  Dry your clothes in an automatic dryer rather than hanging them outside where they might collect pollen.  Rinse hair and eyes before bedtime.  Control of House Dust Mite Allergen Dust mite allergens are a common trigger of allergy  and asthma symptoms. While they can be found throughout the house, these microscopic creatures thrive in warm, humid environments such as bedding, upholstered furniture and carpeting. Because so much time is spent in the bedroom, it is essential to reduce mite levels there.  Encase pillows, mattresses, and box springs in special allergen-proof fabric covers or airtight, zippered plastic covers.  Bedding should be washed weekly in hot water (130 F) and dried in a hot dryer. Allergen-proof covers are available for comforters and pillows that can't be regularly washed.  Wash the allergy -proof covers every few months. Minimize clutter in the bedroom. Keep pets out of the bedroom.  Keep humidity less than 50% by using a dehumidifier or air conditioning. You can  buy a  humidity measuring device called a hygrometer to monitor this.  If possible, replace carpets with hardwood, linoleum, or washable area rugs. If that's not possible, vacuum frequently with a vacuum that has a HEPA filter. Remove all upholstered furniture and non-washable window drapes from the bedroom. Remove all non-washable stuffed toys from the bedroom.  Wash stuffed toys weekly.

## 2024-06-06 DIAGNOSIS — J3089 Other allergic rhinitis: Secondary | ICD-10-CM | POA: Diagnosis not present

## 2024-06-06 NOTE — Progress Notes (Signed)
 VIALS MADE 06-06-24

## 2024-06-06 NOTE — Progress Notes (Signed)
 Aeroallergen Immunotherapy  Ordering Provider: Dr. Orlan Cramp  Patient Details Name: Erin Anderson MRN: [id number] Date of Birth: 1961-04-04  Order 1 of 1  Vial Label: T-W-DM  0.5 ml (Volume)  1:20 Concentration -- Weed Mix* 0.2 ml (Volume)  1:10 Concentration -- Cedar, red 0.5 ml (Volume)   AU Concentration -- Mite Mix (DF 5,000 & DP 5,000)   1.2  ml Extract Subtotal 3.8  ml Diluent  5.0  ml Maintenance Total  Schedule:  B Blue Vial (1:100,000): Schedule B (6 doses) Yellow Vial (1:10,000): Schedule B (6 doses) Green Vial (1:1,000): Schedule B (6 doses) Red Vial (1:100): Schedule A (14 doses)  Special Instructions: May come in 1-2 times a week during build up as tolerated. Once a week on red vial. Once she is on red vial #1 0.5cc go every 2 weeks, on red vial #2 0.5cc go every 4 weeks. May build up red vials faster (0.1, 0.3, 0.5).

## 2024-06-13 ENCOUNTER — Other Ambulatory Visit (HOSPITAL_COMMUNITY): Payer: Self-pay

## 2024-06-28 ENCOUNTER — Ambulatory Visit

## 2024-07-01 ENCOUNTER — Other Ambulatory Visit (HOSPITAL_COMMUNITY): Payer: Self-pay

## 2024-07-02 ENCOUNTER — Other Ambulatory Visit (HOSPITAL_COMMUNITY): Payer: Self-pay

## 2024-08-04 ENCOUNTER — Other Ambulatory Visit: Payer: Self-pay

## 2024-08-04 ENCOUNTER — Other Ambulatory Visit (HOSPITAL_COMMUNITY): Payer: Self-pay

## 2024-08-04 ENCOUNTER — Other Ambulatory Visit: Payer: Self-pay | Admitting: Neurology

## 2024-08-04 MED ORDER — AIMOVIG 140 MG/ML ~~LOC~~ SOAJ
140.0000 mg | SUBCUTANEOUS | 5 refills | Status: AC
  Filled 2024-08-04 – 2024-08-25 (×2): qty 1, 28d supply, fill #0
  Filled 2024-10-01 – 2024-10-23 (×2): qty 1, 28d supply, fill #1

## 2024-08-16 ENCOUNTER — Other Ambulatory Visit (HOSPITAL_COMMUNITY): Payer: Self-pay

## 2024-08-17 ENCOUNTER — Other Ambulatory Visit (HOSPITAL_COMMUNITY): Payer: Self-pay

## 2024-08-25 ENCOUNTER — Other Ambulatory Visit (HOSPITAL_COMMUNITY): Payer: Self-pay

## 2024-08-27 ENCOUNTER — Other Ambulatory Visit (HOSPITAL_COMMUNITY): Payer: Self-pay

## 2024-09-07 ENCOUNTER — Other Ambulatory Visit (HOSPITAL_COMMUNITY): Payer: Self-pay

## 2024-09-07 MED ORDER — VENLAFAXINE HCL ER 75 MG PO CP24
75.0000 mg | ORAL_CAPSULE | Freq: Every day | ORAL | 5 refills | Status: AC
  Filled 2024-09-07 – 2024-09-08 (×2): qty 30, 30d supply, fill #0

## 2024-09-08 ENCOUNTER — Other Ambulatory Visit (HOSPITAL_COMMUNITY): Payer: Self-pay

## 2024-09-09 ENCOUNTER — Other Ambulatory Visit (HOSPITAL_COMMUNITY): Payer: Self-pay

## 2024-09-09 MED ORDER — TEMAZEPAM 15 MG PO CAPS
15.0000 mg | ORAL_CAPSULE | Freq: Every evening | ORAL | 0 refills | Status: DC | PRN
  Filled 2024-09-09: qty 15, 15d supply, fill #0

## 2024-09-09 MED ORDER — SYNTHROID 88 MCG PO TABS
88.0000 ug | ORAL_TABLET | ORAL | 3 refills | Status: DC
  Filled 2024-09-09 – 2024-12-02 (×3): qty 90, 90d supply, fill #0

## 2024-09-09 MED ORDER — VITAMIN D (ERGOCALCIFEROL) 1.25 MG (50000 UNIT) PO CAPS
50000.0000 [IU] | ORAL_CAPSULE | ORAL | 1 refills | Status: AC
  Filled 2024-09-09: qty 12, 84d supply, fill #0

## 2024-09-10 ENCOUNTER — Other Ambulatory Visit (HOSPITAL_COMMUNITY): Payer: Self-pay

## 2024-09-12 ENCOUNTER — Other Ambulatory Visit (HOSPITAL_COMMUNITY): Payer: Self-pay

## 2024-09-12 MED ORDER — VITAMIN D (ERGOCALCIFEROL) 1.25 MG (50000 UNIT) PO CAPS
ORAL_CAPSULE | ORAL | 1 refills | Status: AC
  Filled 2024-09-12: qty 12, 84d supply, fill #0

## 2024-09-12 MED ORDER — BUTALBITAL-APAP-CAFFEINE 50-325-40 MG PO TABS
1.0000 | ORAL_TABLET | Freq: Four times a day (QID) | ORAL | 0 refills | Status: DC
  Filled 2024-09-12: qty 2, 1d supply, fill #0

## 2024-09-13 ENCOUNTER — Other Ambulatory Visit (HOSPITAL_COMMUNITY): Payer: Self-pay

## 2024-09-15 ENCOUNTER — Other Ambulatory Visit (HOSPITAL_BASED_OUTPATIENT_CLINIC_OR_DEPARTMENT_OTHER): Payer: Self-pay

## 2024-09-15 ENCOUNTER — Other Ambulatory Visit: Payer: Self-pay

## 2024-09-15 ENCOUNTER — Other Ambulatory Visit (HOSPITAL_COMMUNITY): Payer: Self-pay

## 2024-09-15 MED ORDER — BUTALBITAL-APAP-CAFFEINE 50-325-40 MG PO TABS
1.0000 | ORAL_TABLET | Freq: Four times a day (QID) | ORAL | 5 refills | Status: AC | PRN
  Filled 2024-09-15: qty 20, 3d supply, fill #0
  Filled 2024-09-17: qty 18, 3d supply, fill #1

## 2024-09-16 ENCOUNTER — Other Ambulatory Visit (HOSPITAL_COMMUNITY): Payer: Self-pay

## 2024-09-17 ENCOUNTER — Other Ambulatory Visit (HOSPITAL_COMMUNITY): Payer: Self-pay

## 2024-09-27 ENCOUNTER — Other Ambulatory Visit (HOSPITAL_COMMUNITY): Payer: Self-pay

## 2024-09-29 ENCOUNTER — Other Ambulatory Visit (HOSPITAL_COMMUNITY): Payer: Self-pay

## 2024-09-29 MED ORDER — AMLODIPINE BESYLATE-VALSARTAN 10-160 MG PO TABS
1.0000 | ORAL_TABLET | Freq: Every day | ORAL | 0 refills | Status: AC
  Filled 2024-09-29: qty 30, 30d supply, fill #0
  Filled 2024-10-23: qty 90, 90d supply, fill #0

## 2024-09-29 MED ORDER — TRAMADOL HCL 50 MG PO TABS
ORAL_TABLET | ORAL | 0 refills | Status: AC
  Filled 2024-09-29: qty 60, 30d supply, fill #0

## 2024-09-29 MED ORDER — FLUTICASONE PROPIONATE 50 MCG/ACT NA SUSP
NASAL | 2 refills | Status: AC
Start: 2024-09-29 — End: ?
  Filled 2024-09-29: qty 16, 30d supply, fill #0

## 2024-09-29 MED ORDER — AJOVY 225 MG/1.5ML ~~LOC~~ SOSY
PREFILLED_SYRINGE | SUBCUTANEOUS | 1 refills | Status: DC
  Filled 2024-09-29: qty 4.5, 90d supply, fill #0

## 2024-09-29 MED ORDER — TIZANIDINE HCL 4 MG PO TABS
ORAL_TABLET | ORAL | 1 refills | Status: AC
  Filled 2024-09-29: qty 60, 30d supply, fill #0
  Filled 2024-12-11: qty 60, 30d supply, fill #1

## 2024-09-29 MED ORDER — ESTRADIOL-NORETHINDRONE ACET 0.5-0.1 MG PO TABS
1.0000 | ORAL_TABLET | Freq: Every day | ORAL | 1 refills | Status: AC
  Filled 2024-09-29: qty 28, 28d supply, fill #0

## 2024-09-29 MED ORDER — OMEPRAZOLE 40 MG PO CPDR
40.0000 mg | DELAYED_RELEASE_CAPSULE | Freq: Every evening | ORAL | 3 refills | Status: AC
  Filled 2024-09-29 – 2024-11-09 (×2): qty 90, 90d supply, fill #0

## 2024-09-29 MED ORDER — ZOLPIDEM TARTRATE 10 MG PO TABS
ORAL_TABLET | ORAL | 0 refills | Status: DC
  Filled 2024-09-29: qty 30, 30d supply, fill #0

## 2024-09-29 MED ORDER — VENLAFAXINE HCL ER 150 MG PO CP24
150.0000 mg | ORAL_CAPSULE | Freq: Every day | ORAL | 0 refills | Status: DC
  Filled 2024-09-29: qty 90, 90d supply, fill #0

## 2024-09-29 MED ORDER — LORAZEPAM 0.5 MG PO TABS
ORAL_TABLET | ORAL | 0 refills | Status: DC
  Filled 2024-09-29: qty 60, 20d supply, fill #0

## 2024-09-29 MED ORDER — BUTALBITAL-APAP-CAFFEINE 50-325-40 MG PO TABS
ORAL_TABLET | ORAL | 2 refills | Status: AC
  Filled 2024-09-29: qty 40, 7d supply, fill #0
  Filled 2024-12-05: qty 40, 7d supply, fill #1

## 2024-09-30 ENCOUNTER — Other Ambulatory Visit (HOSPITAL_COMMUNITY): Payer: Self-pay

## 2024-09-30 ENCOUNTER — Other Ambulatory Visit: Payer: Self-pay

## 2024-10-01 ENCOUNTER — Other Ambulatory Visit (HOSPITAL_COMMUNITY): Payer: Self-pay

## 2024-10-04 ENCOUNTER — Other Ambulatory Visit (HOSPITAL_COMMUNITY): Payer: Self-pay

## 2024-10-11 ENCOUNTER — Other Ambulatory Visit (HOSPITAL_COMMUNITY): Payer: Self-pay

## 2024-10-24 ENCOUNTER — Other Ambulatory Visit: Payer: Self-pay

## 2024-10-24 ENCOUNTER — Other Ambulatory Visit (HOSPITAL_COMMUNITY): Payer: Self-pay

## 2024-10-25 ENCOUNTER — Other Ambulatory Visit (HOSPITAL_COMMUNITY): Payer: Self-pay

## 2024-11-03 ENCOUNTER — Other Ambulatory Visit (HOSPITAL_COMMUNITY): Payer: Self-pay

## 2024-11-03 MED ORDER — VENLAFAXINE HCL ER 75 MG PO CP24
75.0000 mg | ORAL_CAPSULE | Freq: Every day | ORAL | 1 refills | Status: AC
  Filled 2024-11-03: qty 90, 90d supply, fill #0

## 2024-11-03 MED ORDER — GABAPENTIN 300 MG PO CAPS
300.0000 mg | ORAL_CAPSULE | Freq: Every day | ORAL | 3 refills | Status: AC
  Filled 2024-11-03: qty 90, 90d supply, fill #0

## 2024-11-05 ENCOUNTER — Other Ambulatory Visit (HOSPITAL_COMMUNITY): Payer: Self-pay

## 2024-11-07 ENCOUNTER — Other Ambulatory Visit: Payer: Self-pay

## 2024-11-08 ENCOUNTER — Ambulatory Visit

## 2024-11-09 ENCOUNTER — Other Ambulatory Visit: Payer: Self-pay

## 2024-11-09 ENCOUNTER — Other Ambulatory Visit (HOSPITAL_COMMUNITY): Payer: Self-pay

## 2024-11-30 ENCOUNTER — Ambulatory Visit

## 2024-12-02 ENCOUNTER — Other Ambulatory Visit (HOSPITAL_COMMUNITY): Payer: Self-pay

## 2024-12-02 ENCOUNTER — Other Ambulatory Visit: Payer: Self-pay

## 2024-12-03 ENCOUNTER — Other Ambulatory Visit (HOSPITAL_COMMUNITY): Payer: Self-pay

## 2024-12-05 ENCOUNTER — Ambulatory Visit: Admitting: Allergy

## 2024-12-05 ENCOUNTER — Other Ambulatory Visit (HOSPITAL_COMMUNITY): Payer: Self-pay

## 2024-12-05 ENCOUNTER — Other Ambulatory Visit: Payer: Self-pay | Admitting: Allergy

## 2024-12-06 ENCOUNTER — Other Ambulatory Visit: Payer: Self-pay

## 2024-12-06 ENCOUNTER — Other Ambulatory Visit (HOSPITAL_COMMUNITY): Payer: Self-pay

## 2024-12-06 MED ORDER — MONTELUKAST SODIUM 10 MG PO TABS
10.0000 mg | ORAL_TABLET | Freq: Every day | ORAL | 1 refills | Status: AC
  Filled 2024-12-06: qty 30, 30d supply, fill #0
  Filled 2024-12-11: qty 30, 30d supply, fill #1

## 2024-12-06 MED ORDER — LORAZEPAM 0.5 MG PO TABS
0.5000 mg | ORAL_TABLET | Freq: Three times a day (TID) | ORAL | 0 refills | Status: AC | PRN
  Filled 2024-12-06: qty 60, 20d supply, fill #0

## 2024-12-10 ENCOUNTER — Other Ambulatory Visit (HOSPITAL_COMMUNITY): Payer: Self-pay

## 2024-12-11 ENCOUNTER — Other Ambulatory Visit (HOSPITAL_COMMUNITY): Payer: Self-pay

## 2024-12-12 ENCOUNTER — Other Ambulatory Visit: Payer: Self-pay

## 2024-12-15 ENCOUNTER — Ambulatory Visit

## 2024-12-21 ENCOUNTER — Encounter: Payer: Self-pay | Admitting: Physician Assistant

## 2024-12-21 ENCOUNTER — Ambulatory Visit: Admitting: Physician Assistant

## 2024-12-21 VITALS — BP 116/76

## 2024-12-21 DIAGNOSIS — L570 Actinic keratosis: Secondary | ICD-10-CM

## 2024-12-21 DIAGNOSIS — Z1283 Encounter for screening for malignant neoplasm of skin: Secondary | ICD-10-CM | POA: Diagnosis not present

## 2024-12-21 DIAGNOSIS — L821 Other seborrheic keratosis: Secondary | ICD-10-CM | POA: Diagnosis not present

## 2024-12-21 DIAGNOSIS — L578 Other skin changes due to chronic exposure to nonionizing radiation: Secondary | ICD-10-CM

## 2024-12-21 DIAGNOSIS — L814 Other melanin hyperpigmentation: Secondary | ICD-10-CM

## 2024-12-21 DIAGNOSIS — W908XXA Exposure to other nonionizing radiation, initial encounter: Secondary | ICD-10-CM

## 2024-12-21 DIAGNOSIS — D2372 Other benign neoplasm of skin of left lower limb, including hip: Secondary | ICD-10-CM | POA: Diagnosis not present

## 2024-12-21 DIAGNOSIS — D2272 Melanocytic nevi of left lower limb, including hip: Secondary | ICD-10-CM

## 2024-12-21 DIAGNOSIS — D229 Melanocytic nevi, unspecified: Secondary | ICD-10-CM

## 2024-12-21 DIAGNOSIS — D1801 Hemangioma of skin and subcutaneous tissue: Secondary | ICD-10-CM

## 2024-12-21 NOTE — Patient Instructions (Addendum)

## 2024-12-21 NOTE — Progress Notes (Signed)
 "  New Patient Visit   Subjective  Erin Anderson is a 64 y.o. female NEW PATIENT who presents for the following: Skin Cancer Screening and Full Body Skin Exam - No history of skin cancer, Family history of NMSC in mother. She has not had a skin check in the past.  The patient presents for Total-Body Skin Exam (TBSE) for skin cancer screening and mole check. The patient has spots, moles and lesions to be evaluated, some may be new or changing and the patient may have concern these could be cancer.    The following portions of the chart were reviewed this encounter and updated as appropriate: medications, allergies, medical history  Review of Systems:  No other skin or systemic complaints except as noted in HPI or Assessment and Plan.  Objective  Well appearing patient in no apparent distress; mood and affect are within normal limits.  A full examination was performed including scalp, head, eyes, ears, nose, lips, neck, chest, axillae, abdomen, back, buttocks, bilateral upper extremities, bilateral lower extremities, hands, feet, fingers, toes, fingernails, and toenails. All findings within normal limits unless otherwise noted below.   Relevant physical exam findings are noted in the Assessment and Plan.  Left cheek, nose, right chest (3) Erythematous thin papules/macules with gritty scale.   Assessment & Plan   SKIN CANCER SCREENING PERFORMED TODAY.  ACTINIC DAMAGE - Chronic condition, secondary to cumulative UV/sun exposure - diffuse scaly erythematous macules with underlying dyspigmentation - Recommend daily broad spectrum sunscreen SPF 30+ to sun-exposed areas, reapply every 2 hours as needed.  - Staying in the shade or wearing long sleeves, sun glasses (UVA+UVB protection) and wide brim hats (4-inch brim around the entire circumference of the hat) are also recommended for sun protection.  - Call for new or changing lesions.  LENTIGINES, SEBORRHEIC KERATOSES, HEMANGIOMAS -  Benign normal skin lesions - Benign-appearing - Call for any changes  MELANOCYTIC NEVI - Tan-brown and/or pink-flesh-colored symmetric macules and papules - Benign appearing on exam today - Observation - Call clinic for new or changing moles - Recommend daily use of broad spectrum spf 30+ sunscreen to sun-exposed areas.   DERMATOFIBROMA Exam: Firm pink/brown papulenodule with dimple sign of left pretibial.  Treatment Plan: A dermatofibroma is a benign growth possibly related to trauma, such as an insect bite, cut from shaving, or inflamed acne-type bump.  Treatment options to remove include shave or excision with resulting scar and risk of recurrence.  Since benign-appearing and not bothersome, will observe for now.    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 within lighter tan patch of left thigh - Genetic - Benign, observe - Call for any changes   AK (ACTINIC KERATOSIS) (3) Left cheek, nose, right chest (3) Recheck on follow up - Destruction of lesion - Left cheek, nose, right chest (3) Complexity: simple   Destruction method: cryotherapy   Informed consent: discussed and consent obtained   Timeout:  patient name, date of birth, surgical site, and procedure verified Lesion destroyed using liquid nitrogen: Yes   Region frozen until ice ball extended beyond lesion: Yes   Outcome: patient tolerated procedure well with no complications   Post-procedure details: wound care instructions given    ACTINIC SKIN DAMAGE   CHERRY ANGIOMA   LENTIGINES   MULTIPLE BENIGN NEVI   SEBORRHEIC KERATOSIS   SCREENING EXAM FOR SKIN CANCER    Return in about 4 months (around 04/20/2025) for AK follow up.  I, Roseline Hutchinson, CMA, am acting  as scribe for Ayrabella Labombard K, PA-C .   Documentation: I have reviewed the above documentation for accuracy and completeness, and I agree with the above.  Kimblery Diop K, PA-C    "

## 2024-12-22 ENCOUNTER — Ambulatory Visit (INDEPENDENT_AMBULATORY_CARE_PROVIDER_SITE_OTHER)

## 2024-12-22 DIAGNOSIS — J302 Other seasonal allergic rhinitis: Secondary | ICD-10-CM | POA: Diagnosis not present

## 2024-12-27 ENCOUNTER — Ambulatory Visit

## 2024-12-27 DIAGNOSIS — J302 Other seasonal allergic rhinitis: Secondary | ICD-10-CM

## 2024-12-29 ENCOUNTER — Ambulatory Visit

## 2024-12-29 DIAGNOSIS — J302 Other seasonal allergic rhinitis: Secondary | ICD-10-CM

## 2025-01-16 ENCOUNTER — Ambulatory Visit: Admitting: Allergy

## 2025-04-18 ENCOUNTER — Ambulatory Visit: Admitting: Physician Assistant
